# Patient Record
Sex: Female | Born: 1970 | Race: White | Hispanic: No | Marital: Single | Smoking: Never smoker
Health system: Southern US, Community
[De-identification: ages and names within clinical notes are randomized; demographics above are authoritative.]

## PROBLEM LIST (undated history)

## (undated) DIAGNOSIS — G709 Myoneural disorder, unspecified: Secondary | ICD-10-CM

## (undated) DIAGNOSIS — T8859XA Other complications of anesthesia, initial encounter: Secondary | ICD-10-CM

## (undated) DIAGNOSIS — R51 Headache: Secondary | ICD-10-CM

## (undated) DIAGNOSIS — E039 Hypothyroidism, unspecified: Secondary | ICD-10-CM

## (undated) DIAGNOSIS — R7303 Prediabetes: Secondary | ICD-10-CM

## (undated) DIAGNOSIS — Z9889 Other specified postprocedural states: Secondary | ICD-10-CM

## (undated) DIAGNOSIS — R112 Nausea with vomiting, unspecified: Secondary | ICD-10-CM

## (undated) DIAGNOSIS — K219 Gastro-esophageal reflux disease without esophagitis: Secondary | ICD-10-CM

## (undated) DIAGNOSIS — M5430 Sciatica, unspecified side: Secondary | ICD-10-CM

## (undated) DIAGNOSIS — I10 Essential (primary) hypertension: Secondary | ICD-10-CM

## (undated) DIAGNOSIS — F419 Anxiety disorder, unspecified: Secondary | ICD-10-CM

## (undated) DIAGNOSIS — D219 Benign neoplasm of connective and other soft tissue, unspecified: Secondary | ICD-10-CM

## (undated) HISTORY — PX: OTHER SURGICAL HISTORY: SHX169

## (undated) HISTORY — DX: Benign neoplasm of connective and other soft tissue, unspecified: D21.9

---

## 1998-04-17 ENCOUNTER — Emergency Department (HOSPITAL_COMMUNITY): Admission: EM | Admit: 1998-04-17 | Discharge: 1998-04-17 | Payer: Self-pay | Admitting: Emergency Medicine

## 1998-06-21 ENCOUNTER — Other Ambulatory Visit: Admission: RE | Admit: 1998-06-21 | Discharge: 1998-06-21 | Payer: Self-pay | Admitting: Obstetrics and Gynecology

## 1999-08-24 ENCOUNTER — Emergency Department (HOSPITAL_COMMUNITY): Admission: EM | Admit: 1999-08-24 | Discharge: 1999-08-24 | Payer: Self-pay | Admitting: Emergency Medicine

## 2000-10-27 ENCOUNTER — Emergency Department (HOSPITAL_COMMUNITY): Admission: EM | Admit: 2000-10-27 | Discharge: 2000-10-27 | Payer: Self-pay | Admitting: Emergency Medicine

## 2000-10-28 ENCOUNTER — Encounter: Payer: Self-pay | Admitting: Emergency Medicine

## 2001-12-09 ENCOUNTER — Emergency Department (HOSPITAL_COMMUNITY): Admission: EM | Admit: 2001-12-09 | Discharge: 2001-12-09 | Payer: Self-pay | Admitting: Emergency Medicine

## 2002-01-27 ENCOUNTER — Emergency Department (HOSPITAL_COMMUNITY): Admission: EM | Admit: 2002-01-27 | Discharge: 2002-01-27 | Payer: Self-pay | Admitting: Emergency Medicine

## 2002-01-27 ENCOUNTER — Encounter: Payer: Self-pay | Admitting: Emergency Medicine

## 2002-01-28 ENCOUNTER — Encounter: Payer: Self-pay | Admitting: Emergency Medicine

## 2002-12-23 ENCOUNTER — Emergency Department (HOSPITAL_COMMUNITY): Admission: EM | Admit: 2002-12-23 | Discharge: 2002-12-23 | Payer: Self-pay | Admitting: Emergency Medicine

## 2002-12-23 ENCOUNTER — Encounter: Payer: Self-pay | Admitting: Emergency Medicine

## 2002-12-23 ENCOUNTER — Ambulatory Visit (HOSPITAL_COMMUNITY): Admission: RE | Admit: 2002-12-23 | Discharge: 2002-12-23 | Payer: Self-pay | Admitting: Emergency Medicine

## 2004-01-15 ENCOUNTER — Emergency Department (HOSPITAL_COMMUNITY): Admission: EM | Admit: 2004-01-15 | Discharge: 2004-01-15 | Payer: Self-pay | Admitting: Family Medicine

## 2004-03-10 ENCOUNTER — Emergency Department (HOSPITAL_COMMUNITY): Admission: EM | Admit: 2004-03-10 | Discharge: 2004-03-11 | Payer: Self-pay | Admitting: Emergency Medicine

## 2005-04-26 ENCOUNTER — Emergency Department (HOSPITAL_COMMUNITY): Admission: EM | Admit: 2005-04-26 | Discharge: 2005-04-26 | Payer: Self-pay | Admitting: Emergency Medicine

## 2008-01-08 ENCOUNTER — Emergency Department (HOSPITAL_COMMUNITY): Admission: EM | Admit: 2008-01-08 | Discharge: 2008-01-08 | Payer: Self-pay | Admitting: Emergency Medicine

## 2009-02-25 ENCOUNTER — Ambulatory Visit: Payer: Self-pay | Admitting: Diagnostic Radiology

## 2009-02-25 ENCOUNTER — Emergency Department (HOSPITAL_BASED_OUTPATIENT_CLINIC_OR_DEPARTMENT_OTHER): Admission: EM | Admit: 2009-02-25 | Discharge: 2009-02-25 | Payer: Self-pay | Admitting: Emergency Medicine

## 2010-07-06 ENCOUNTER — Emergency Department (HOSPITAL_COMMUNITY): Admission: EM | Admit: 2010-07-06 | Discharge: 2009-08-20 | Payer: Self-pay | Admitting: Internal Medicine

## 2011-01-24 ENCOUNTER — Other Ambulatory Visit: Payer: Self-pay | Admitting: Obstetrics and Gynecology

## 2011-01-24 DIAGNOSIS — R928 Other abnormal and inconclusive findings on diagnostic imaging of breast: Secondary | ICD-10-CM

## 2011-01-30 ENCOUNTER — Ambulatory Visit
Admission: RE | Admit: 2011-01-30 | Discharge: 2011-01-30 | Disposition: A | Discharge status: Home / Self Care | Payer: BC Managed Care – PPO | Source: Ambulatory Visit | Attending: Obstetrics and Gynecology | Admitting: Obstetrics and Gynecology

## 2011-01-30 DIAGNOSIS — R928 Other abnormal and inconclusive findings on diagnostic imaging of breast: Secondary | ICD-10-CM

## 2011-04-26 LAB — PREGNANCY, URINE: Preg Test, Ur: NEGATIVE

## 2011-05-21 ENCOUNTER — Other Ambulatory Visit (HOSPITAL_COMMUNITY): Payer: BC Managed Care – PPO

## 2011-05-28 ENCOUNTER — Encounter: Payer: Self-pay | Admitting: *Deleted

## 2011-05-28 ENCOUNTER — Other Ambulatory Visit: Payer: Self-pay

## 2011-05-28 DIAGNOSIS — Z79899 Other long term (current) drug therapy: Secondary | ICD-10-CM | POA: Insufficient documentation

## 2011-05-28 DIAGNOSIS — R079 Chest pain, unspecified: Secondary | ICD-10-CM | POA: Insufficient documentation

## 2011-05-28 DIAGNOSIS — I10 Essential (primary) hypertension: Secondary | ICD-10-CM | POA: Insufficient documentation

## 2011-05-28 DIAGNOSIS — F411 Generalized anxiety disorder: Secondary | ICD-10-CM | POA: Insufficient documentation

## 2011-05-28 NOTE — ED Notes (Signed)
States she is upset over a co pay to her surgeon, states she is stressed about money, states she does have a job

## 2011-05-29 ENCOUNTER — Emergency Department (HOSPITAL_COMMUNITY)
Admission: EM | Admit: 2011-05-29 | Discharge: 2011-05-29 | Disposition: A | Discharge status: Home / Self Care | Payer: BC Managed Care – PPO | Attending: Emergency Medicine | Admitting: Emergency Medicine

## 2011-05-29 DIAGNOSIS — F419 Anxiety disorder, unspecified: Secondary | ICD-10-CM

## 2011-05-29 HISTORY — DX: Essential (primary) hypertension: I10

## 2011-05-29 MED ORDER — GI COCKTAIL ~~LOC~~
30.0000 mL | Freq: Once | ORAL | Status: AC
  Administered 2011-05-29: 30 mL via ORAL
  Filled 2011-05-29: qty 30

## 2011-05-29 NOTE — ED Notes (Addendum)
Pt reports epigastric discomfort that began this a.m. - pt states she took Tagamet at home w/ some relief. Pt denies any other associating symptoms. Pt concerned about her blood pressure being high and is experiencing stress over finances related to a surgery the pt is to have. Pt resting comfortably on bed in no acute distress.

## 2011-05-29 NOTE — ED Notes (Signed)
D/c instructions reviewed w/ pt - pt denies any further questions or concerns at present.   

## 2011-05-29 NOTE — ED Provider Notes (Signed)
History     CSN: [id number] Arrival date & time: 05/29/2011 12:47 AM   First MD Initiated Contact with Patient 05/29/11 0050      Chief Complaint  Patient presents with  . Anxiety     Patient is a 40 y.o. female presenting with anxiety. The history is provided by the patient.  Anxiety   patient reports she is anxious about an upcoming surgery and about her ability to be a lipase of this.  She reports she is stressed about money and she doesn't have a job.  She reports constant burning and discomfort in her chest today that has been improved and relieved by her Tagamet at home.  She is also reports she is concerned about her blood pressure.  She is on hydrochlorothiazide at home as prescribed by her OB/GYN.  His other shortness of breath.  She's had no nausea vomiting diarrhea or abdominal pain.  She's had no fevers or chills.  Nothing worsens her pain.  Her chest discomfort is improved by Tagamet.  Nothing is relieved her anxiety, her symptoms are described as mild.  Her chest discomfort as a 1/10.  Her chest discomfort is described as a discomfort and burning.  Past Medical History  Diagnosis Date  . Hypertension     Past Surgical History  Procedure Date  . Cesarean section   . Spleenectomy     No family history on file.  History  Substance Use Topics  . Smoking status: Never Smoker   . Smokeless tobacco: Not on file  . Alcohol Use: No    OB History    Grav Para Term Preterm Abortions TAB SAB Ect Mult Living                  Review of Systems  All other systems reviewed and are negative.    Allergies  Review of patient's allergies indicates no known allergies.  Home Medications   Cimetidine, ibuprofen, HCTZ, loratadine  BP 158/103  Pulse 81  Temp(Src) 97.7 F (36.5 C) (Oral)  Resp 20  Ht 5\' 2"  (1.575 m)  Wt 185 lb (83.915 kg)  BMI 33.84 kg/m2  SpO2 100%  LMP 05/15/2011  Physical Exam  Nursing note and vitals reviewed. Constitutional: She is  oriented to person, place, and time. She appears well-developed and well-nourished. No distress.  HENT:  Head: Normocephalic and atraumatic.  Eyes: EOM are normal.  Neck: Normal range of motion.  Cardiovascular: Normal rate, regular rhythm and normal heart sounds.   Pulmonary/Chest: Effort normal and breath sounds normal.  Abdominal: Soft. She exhibits no distension. There is no tenderness.  Musculoskeletal: Normal range of motion.  Neurological: She is alert and oriented to person, place, and time.  Skin: Skin is warm and dry.  Psychiatric: Judgment normal.       Anxious    ED Course  Procedures (including critical care time)  Labs Reviewed - No data to display No results found.  Date: 05/29/2011  Rate: 74  Rhythm: normal sinus rhythm  QRS Axis: normal  Intervals: normal  ST/T Wave abnormalities: normal  Conduction Disutrbances:none  Narrative Interpretation:   Old EKG Reviewed: No significant changes noted     1. Chest pain   2. Anxiety       MDM  Atypical chest pain with normal EKG.  Patient is very anxious.  She is to followup with her primary care physician.  Doubt ACS.  Doubt PE.  Doubt dissection.  Hypertension noted.  She is  on hydrochlorothiazide.  She's been encouraged to follow up with her primary care physician who is her OB/GYN at this time for recheck of her blood pressure and continued monitoring.        Lyanne Co, MD 05/29/11 [phone]

## 2011-06-25 ENCOUNTER — Inpatient Hospital Stay (HOSPITAL_COMMUNITY): Admission: RE | Admit: 2011-06-25 | Payer: BC Managed Care – PPO | Source: Ambulatory Visit

## 2011-06-28 ENCOUNTER — Encounter (HOSPITAL_COMMUNITY): Admission: RE | Payer: Self-pay | Source: Ambulatory Visit

## 2011-06-28 ENCOUNTER — Ambulatory Visit (HOSPITAL_COMMUNITY)
Admission: RE | Admit: 2011-06-28 | Payer: BC Managed Care – PPO | Source: Ambulatory Visit | Admitting: Obstetrics and Gynecology

## 2011-06-28 SURGERY — LIGATION, FALLOPIAN TUBE, LAPAROSCOPIC
Anesthesia: Choice

## 2011-12-14 ENCOUNTER — Encounter (HOSPITAL_COMMUNITY): Payer: Self-pay | Admitting: Pharmacist

## 2011-12-21 ENCOUNTER — Encounter (HOSPITAL_COMMUNITY)
Admission: RE | Admit: 2011-12-21 | Discharge: 2011-12-21 | Disposition: A | Discharge status: Home / Self Care | Payer: BC Managed Care – PPO | Source: Ambulatory Visit | Attending: Obstetrics and Gynecology | Admitting: Obstetrics and Gynecology

## 2011-12-21 ENCOUNTER — Encounter (HOSPITAL_COMMUNITY): Payer: Self-pay

## 2011-12-21 HISTORY — DX: Headache: R51

## 2011-12-21 HISTORY — DX: Gastro-esophageal reflux disease without esophagitis: K21.9

## 2011-12-21 HISTORY — DX: Hypothyroidism, unspecified: E03.9

## 2011-12-21 HISTORY — DX: Anxiety disorder, unspecified: F41.9

## 2011-12-21 LAB — CBC
MCH: 26.3 pg (ref 26.0–34.0)
MCV: 83.2 fL (ref 78.0–100.0)
Platelets: 469 10*3/uL — ABNORMAL HIGH (ref 150–400)
RDW: 13.5 % (ref 11.5–15.5)

## 2011-12-21 LAB — BASIC METABOLIC PANEL
Calcium: 9 mg/dL (ref 8.4–10.5)
Creatinine, Ser: 0.7 mg/dL (ref 0.50–1.10)
GFR calc Af Amer: >90 mL/min (ref >90)

## 2011-12-21 NOTE — H&P (Signed)
Christine Rush, PENDERGRAPH NO.:  [id number]  MEDICAL RECORD NO.:  [id number]  LOCATION:  [facility]                           FACILITY:  [facility]  PHYSICIAN:  Juluis Mire, M.D.   DATE OF BIRTH:  [birth year]  DATE OF ADMISSION:  12/21/2011 DATE OF DISCHARGE:                             HISTORY & PHYSICAL   DATE OF SURGERY:  Dec 27, 2011 at [facility].  The patient is a 41 year old, gravida 1, para 1 female, presents for laparoscopic bilateral tubal ligation, hysteroscopy with resectoscope, and hydrothermal ablation.  In relation to the present admission, the patient has regular cycles. They are relatively heavy with associated dysmenorrhea.  She has known uterine fibroids, but we will watch conservatively.  Because of increasing menstrual flow and discomfort, we are going to proceed with the above-noted surgery.  Alternatives have been discussed including the use of the Mirena IUD. We also discussed radiological embolization of the fibroids.  She declines this and wishes to have more aggressive therapy as noted above. She does not wish to proceed with hysterectomy at this point in time.  In terms of allergies, she has no drug allergies listed.  PAST MEDICAL HISTORY:  Usual childhood diseases.  No significant sequelae.  She is being managed for hypertension on hydrochlorothiazide.  PAST SURGICAL HISTORY:  She has had splenectomy and one cesarean section.  SOCIAL HISTORY:  No tobacco or alcohol use.  FAMILY HISTORY:  There is a history of breast cancer as well as heart disease.  REVIEW OF SYSTEMS:  Noncontributory.  PHYSICAL EXAMINATION:  VITAL SIGNS:  The patient is afebrile with stable vital signs. HEENT:  The patient is normocephalic.  Pupils equal, round, and reactive to light and accommodation.  Extraocular movements are intact.  Sclerae and conjunctivae are clear.  Oropharynx clear. NECK:  Not examined. BREASTS:  Not examined. LUNGS:   Clear. CARDIOVASCULAR SYSTEM:  Regular rhythm and rate.  There are no murmurs or gallops. ABDOMEN:  Her abdominal exam is benign.  No mass, organomegaly, or tenderness.  Well-healed low-transverse incision. PELVIC:  Normal external genitalia.  Vaginal mucosa is clear.  Cervix unremarkable.  Uterus 12-14 weeks in size consistent with uterine fibroids.  Adnexa unremarkable.  EXTREMITIES:  Trace edema. NEUROLOGIC:  Grossly within normal limits.  IMPRESSION: 1. Menorrhagia with associated uterine fibroids. 2. Desires sterility. 3. Hypertension.  PLAN:  The patient to undergo laparoscopic bilateral tubal ligation. The risks of the surgery have been explained including the risk of infection, the risk of hemorrhage that could require transfusion with the risk of AIDS or hepatitis.  Excessive bleeding that could require exploratory surgery.  Risk of injury to adjacent organs such as bladder, bowel, or ureters that could require further exploratory surgery, risk of deep venous thrombosis and pulmonary embolus.  The potential irreversibility of sterilization explained.  A failure rate of 1 in 200 failures can be in the form of ectopic pregnancy requiring further surgical management.  Alternatives for birth control have been explained.  With the ablation, success rates of approximately 80% are quoted.  The risks include as noted above plus the risk of uterine perforation that can lead to injury to adjacent organs  requiring exploratory surgery.  Excessive bleeding could require hysterectomy. The patient expressed understanding of the indications, risks, and alternatives.     Juluis Mire, M.D.     JSM/MEDQ  D:  12/21/2011  T:  12/21/2011  Job:  [id number]

## 2011-12-21 NOTE — Patient Instructions (Addendum)
YOUR PROCEDURE IS SCHEDULED ON:12/27/11  ENTER THROUGH THE MAIN ENTRANCE OF [facility] AT:6am  USE DESK PHONE AND DIAL [id number] TO INFORM us OF YOUR ARRIVAL  CALL [phone] IF YOU HAVE ANY QUESTIONS OR PROBLEMS PRIOR TO YOUR ARRIVAL.  REMEMBER: DO NOT EAT OR DRINK AFTER MIDNIGHT :Wed   YOU MAY BRUSH YOUR TEETH THE MORNING OF SURGERY   TAKE THESE MEDICINES THE DAY OF SURGERY WITH SIP OF WATER:BP med and Thyroid med   DO NOT WEAR JEWELRY, EYE MAKEUP, LIPSTICK OR DARK FINGERNAIL POLISH DO NOT WEAR LOTIONS  DO NOT SHAVE FOR 48 HOURS PRIOR TO SURGERY  YOU WILL NOT BE ALLOWED TO DRIVE YOURSELF HOME.  NAME OF DRIVER:Amber- daughter

## 2011-12-21 NOTE — H&P (Signed)
  Patient name Christine Rush, Glanz DICTATION#  [id number] CSN# [id number]  [facility], MD 12/21/2011 9:03 AM

## 2011-12-26 MED ORDER — CEFAZOLIN SODIUM-DEXTROSE 2-3 GM-% IV SOLR
2.0000 g | INTRAVENOUS | Status: AC
  Administered 2011-12-27: 2 g via INTRAVENOUS
  Filled 2011-12-26: qty 50

## 2011-12-27 ENCOUNTER — Encounter (HOSPITAL_COMMUNITY): Payer: Self-pay | Admitting: *Deleted

## 2011-12-27 ENCOUNTER — Ambulatory Visit (HOSPITAL_COMMUNITY): Payer: BC Managed Care – PPO | Admitting: Anesthesiology

## 2011-12-27 ENCOUNTER — Ambulatory Visit (HOSPITAL_COMMUNITY)
Admission: RE | Admit: 2011-12-27 | Discharge: 2011-12-27 | Disposition: A | Discharge status: Home / Self Care | Payer: BC Managed Care – PPO | Source: Ambulatory Visit | Attending: Obstetrics and Gynecology | Admitting: Obstetrics and Gynecology

## 2011-12-27 ENCOUNTER — Encounter (HOSPITAL_COMMUNITY)
Admission: RE | Disposition: A | Discharge status: Home / Self Care | Payer: Self-pay | Source: Ambulatory Visit | Attending: Obstetrics and Gynecology

## 2011-12-27 ENCOUNTER — Encounter (HOSPITAL_COMMUNITY): Payer: Self-pay | Admitting: Anesthesiology

## 2011-12-27 DIAGNOSIS — Z01812 Encounter for preprocedural laboratory examination: Secondary | ICD-10-CM | POA: Insufficient documentation

## 2011-12-27 DIAGNOSIS — D259 Leiomyoma of uterus, unspecified: Secondary | ICD-10-CM | POA: Insufficient documentation

## 2011-12-27 DIAGNOSIS — D219 Benign neoplasm of connective and other soft tissue, unspecified: Secondary | ICD-10-CM

## 2011-12-27 DIAGNOSIS — Z01818 Encounter for other preprocedural examination: Secondary | ICD-10-CM | POA: Insufficient documentation

## 2011-12-27 DIAGNOSIS — Z302 Encounter for sterilization: Secondary | ICD-10-CM | POA: Insufficient documentation

## 2011-12-27 DIAGNOSIS — N920 Excessive and frequent menstruation with regular cycle: Secondary | ICD-10-CM | POA: Insufficient documentation

## 2011-12-27 DIAGNOSIS — N979 Female infertility, unspecified: Secondary | ICD-10-CM

## 2011-12-27 HISTORY — PX: LAPAROSCOPIC TUBAL LIGATION: SHX1937

## 2011-12-27 LAB — HCG, SERUM, QUALITATIVE: Preg, Serum: NEGATIVE

## 2011-12-27 SURGERY — LIGATION, FALLOPIAN TUBE, LAPAROSCOPIC
Anesthesia: General | Site: Vagina | Wound class: Clean Contaminated

## 2011-12-27 MED ORDER — GLYCINE 1.5 % IR SOLN
Status: DC | PRN
  Administered 2011-12-27: 1

## 2011-12-27 MED ORDER — MIDAZOLAM HCL 5 MG/5ML IJ SOLN
INTRAMUSCULAR | Status: DC | PRN
  Administered 2011-12-27: 2 mg via INTRAVENOUS

## 2011-12-27 MED ORDER — FENTANYL CITRATE 0.05 MG/ML IJ SOLN
INTRAMUSCULAR | Status: DC | PRN
  Administered 2011-12-27 (×2): 50 ug via INTRAVENOUS
  Administered 2011-12-27: 100 ug via INTRAVENOUS

## 2011-12-27 MED ORDER — PROPOFOL 10 MG/ML IV EMUL
INTRAVENOUS | Status: AC
  Filled 2011-12-27: qty 20

## 2011-12-27 MED ORDER — DEXAMETHASONE SODIUM PHOSPHATE 10 MG/ML IJ SOLN
INTRAMUSCULAR | Status: AC
  Filled 2011-12-27: qty 1

## 2011-12-27 MED ORDER — METOCLOPRAMIDE HCL 5 MG/ML IJ SOLN
10.0000 mg | Freq: Once | INTRAMUSCULAR | Status: AC
  Administered 2011-12-27: 10 mg via INTRAVENOUS

## 2011-12-27 MED ORDER — PANTOPRAZOLE SODIUM 40 MG PO TBEC
40.0000 mg | DELAYED_RELEASE_TABLET | Freq: Once | ORAL | Status: AC
  Administered 2011-12-27: 40 mg via ORAL

## 2011-12-27 MED ORDER — GLYCOPYRROLATE 0.2 MG/ML IJ SOLN
INTRAMUSCULAR | Status: DC | PRN
  Administered 2011-12-27: 0.2 mg via INTRAVENOUS

## 2011-12-27 MED ORDER — LIDOCAINE-EPINEPHRINE (PF) 1 %-1:200000 IJ SOLN
INTRAMUSCULAR | Status: DC | PRN
  Administered 2011-12-27: 20 mL

## 2011-12-27 MED ORDER — BUPIVACAINE HCL (PF) 0.25 % IJ SOLN
INTRAMUSCULAR | Status: DC | PRN
  Administered 2011-12-27: 7 mL

## 2011-12-27 MED ORDER — FENTANYL CITRATE 0.05 MG/ML IJ SOLN: 25.0000 ug | INTRAMUSCULAR | Status: DC | PRN

## 2011-12-27 MED ORDER — ONDANSETRON HCL 4 MG/2ML IJ SOLN
INTRAMUSCULAR | Status: AC
  Filled 2011-12-27: qty 2

## 2011-12-27 MED ORDER — EPHEDRINE 5 MG/ML INJ
INTRAVENOUS | Status: AC
  Filled 2011-12-27: qty 10

## 2011-12-27 MED ORDER — LIDOCAINE-EPINEPHRINE (PF) 1 %-1:200000 IJ SOLN
INTRAMUSCULAR | Status: AC
  Filled 2011-12-27: qty 10

## 2011-12-27 MED ORDER — ONDANSETRON HCL 4 MG/2ML IJ SOLN
INTRAMUSCULAR | Status: DC | PRN
  Administered 2011-12-27: 4 mg via INTRAVENOUS

## 2011-12-27 MED ORDER — DEXAMETHASONE SODIUM PHOSPHATE 4 MG/ML IJ SOLN
INTRAMUSCULAR | Status: DC | PRN
  Administered 2011-12-27: 4 mg via INTRAVENOUS

## 2011-12-27 MED ORDER — PROPOFOL 10 MG/ML IV EMUL
INTRAVENOUS | Status: DC | PRN
  Administered 2011-12-27: 200 mg via INTRAVENOUS

## 2011-12-27 MED ORDER — GLYCOPYRROLATE 0.2 MG/ML IJ SOLN
INTRAMUSCULAR | Status: AC
  Filled 2011-12-27: qty 1

## 2011-12-27 MED ORDER — KETOROLAC TROMETHAMINE 30 MG/ML IJ SOLN
INTRAMUSCULAR | Status: DC | PRN
  Administered 2011-12-27: 30 mg via INTRAVENOUS

## 2011-12-27 MED ORDER — KETOROLAC TROMETHAMINE 30 MG/ML IJ SOLN: 15.0000 mg | Freq: Once | INTRAMUSCULAR | Status: DC | PRN

## 2011-12-27 MED ORDER — ROCURONIUM BROMIDE 50 MG/5ML IV SOLN
INTRAVENOUS | Status: AC
  Filled 2011-12-27: qty 1

## 2011-12-27 MED ORDER — MIDAZOLAM HCL 2 MG/2ML IJ SOLN
INTRAMUSCULAR | Status: AC
  Filled 2011-12-27: qty 2

## 2011-12-27 MED ORDER — LACTATED RINGERS IV SOLN
INTRAVENOUS | Status: DC | PRN
  Administered 2011-12-27 (×2): via INTRAVENOUS

## 2011-12-27 MED ORDER — SUCCINYLCHOLINE CHLORIDE 20 MG/ML IJ SOLN
INTRAMUSCULAR | Status: DC | PRN
  Administered 2011-12-27: 120 mg via INTRAVENOUS

## 2011-12-27 MED ORDER — BUPIVACAINE HCL (PF) 0.25 % IJ SOLN
INTRAMUSCULAR | Status: AC
  Filled 2011-12-27: qty 30

## 2011-12-27 MED ORDER — KETOROLAC TROMETHAMINE 30 MG/ML IJ SOLN
INTRAMUSCULAR | Status: AC
  Filled 2011-12-27: qty 1

## 2011-12-27 MED ORDER — PANTOPRAZOLE SODIUM 40 MG PO TBEC
DELAYED_RELEASE_TABLET | ORAL | Status: AC
  Filled 2011-12-27: qty 1

## 2011-12-27 MED ORDER — EPHEDRINE SULFATE 50 MG/ML IJ SOLN
INTRAMUSCULAR | Status: DC | PRN
  Administered 2011-12-27: 5 mg via INTRAVENOUS

## 2011-12-27 MED ORDER — CEFAZOLIN SODIUM 1-5 GM-% IV SOLN
INTRAVENOUS | Status: AC
  Filled 2011-12-27: qty 50

## 2011-12-27 MED ORDER — LIDOCAINE HCL (CARDIAC) 20 MG/ML IV SOLN
INTRAVENOUS | Status: AC
  Filled 2011-12-27: qty 5

## 2011-12-27 MED ORDER — LACTATED RINGERS IV SOLN
INTRAVENOUS | Status: DC
  Administered 2011-12-27: 06:00:00 via INTRAVENOUS

## 2011-12-27 MED ORDER — LIDOCAINE HCL (CARDIAC) 20 MG/ML IV SOLN
INTRAVENOUS | Status: DC | PRN
  Administered 2011-12-27: 60 mg via INTRAVENOUS

## 2011-12-27 MED ORDER — FENTANYL CITRATE 0.05 MG/ML IJ SOLN
INTRAMUSCULAR | Status: AC
  Filled 2011-12-27: qty 5

## 2011-12-27 MED ORDER — METOCLOPRAMIDE HCL 5 MG/ML IJ SOLN
INTRAMUSCULAR | Status: AC
  Administered 2011-12-27: 10 mg via INTRAVENOUS
  Filled 2011-12-27: qty 2

## 2011-12-27 SURGICAL SUPPLY — 19 items
CANISTER SUCTION 2500CC (MISCELLANEOUS) ×3 IMPLANT
CATH ROBINSON RED A/P 16FR (CATHETERS) ×3 IMPLANT
CATH THERMACHOICE III (CATHETERS) ×1 IMPLANT
CLOTH BEACON ORANGE TIMEOUT ST (SAFETY) ×3 IMPLANT
CONTAINER PREFILL 10% NBF 60ML (FORM) ×5 IMPLANT
DRSG COVADERM PLUS 2X2 (GAUZE/BANDAGES/DRESSINGS) ×1 IMPLANT
ELECT REM PT RETURN 9FT ADLT (ELECTROSURGICAL) ×3
ELECTRODE REM PT RTRN 9FT ADLT (ELECTROSURGICAL) ×2 IMPLANT
GLOVE BIO SURGEON STRL SZ7 (GLOVE) ×7 IMPLANT
NDL INSUFFLATION 14GA 120MM (NEEDLE) IMPLANT
NDL SPNL 20GX3.5 QUINCKE YW (NEEDLE) ×2 IMPLANT
NEEDLE INSUFFLATION 14GA 120MM (NEEDLE) IMPLANT
NEEDLE SPNL 20GX3.5 QUINCKE YW (NEEDLE) ×3 IMPLANT
PACK LAPAROSCOPY BASIN (CUSTOM PROCEDURE TRAY) ×3 IMPLANT
SET GENESYS HTA PROCERVA (MISCELLANEOUS) ×1 IMPLANT
SUT VIC AB 3-0 FS2 27 (SUTURE) ×3 IMPLANT
SUT VICRYL 0 UR6 27IN ABS (SUTURE) ×1 IMPLANT
TROCAR BALLN 12MMX100 BLUNT (TROCAR) ×1 IMPLANT
WARMER LAPAROSCOPE (MISCELLANEOUS) ×3 IMPLANT

## 2011-12-27 NOTE — Anesthesia Preprocedure Evaluation (Addendum)
Anesthesia Evaluation  Patient identified by MRN, date of birth, ID band Patient awake    Reviewed: Allergy & Precautions, H&P , NPO status , Patient's Chart, lab work & pertinent test results, reviewed documented beta blocker date and time   History of Anesthesia Complications Negative for: history of anesthetic complications  Airway Mallampati: I TM Distance: >3 FB Neck ROM: full    Dental  (+) Teeth Intact   Pulmonary neg pulmonary ROS,  breath sounds clear to auscultation  Pulmonary exam normal       Cardiovascular hypertension, Rhythm:regular Rate:Normal     Neuro/Psych  Headaches (migraines - infrequent, last was yesterday), PSYCHIATRIC DISORDERS (anxiety) negative psych ROS   GI/Hepatic Neg liver ROS, GERD-  ,  Endo/Other  Hypothyroidism obese  Renal/GU negative Renal ROS  negative genitourinary   Musculoskeletal   Abdominal   Peds  Hematology negative hematology ROS (+) 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   Dental Advisory Given  Plan Discussed with: CRNA and Surgeon  Anesthesia Plan Comments:         Anesthesia Quick Evaluation

## 2011-12-27 NOTE — Op Note (Signed)
Christine Rush, Christine Rush               ACCOUNT NO.:  [id number]  MEDICAL RECORD NO.:  [id number]  LOCATION:  [facility]                          FACILITY:  [facility]  PHYSICIAN:  Juluis Mire, M.D.   DATE OF BIRTH:  September 14, 1970  DATE OF PROCEDURE:  12/27/2011 DATE OF DISCHARGE:                              OPERATIVE REPORT   PREOPERATIVE DIAGNOSIS:  Menorrhagia secondary to uterine fibroids.  POSTOPERATIVE DIAGNOSIS:  Menorrhagia secondary to uterine fibroids.  OPERATIVE PROCEDURE: 1. Hysteroscopy with endometrial curettings. 2. Attempt at hydrothermal ablation, attempt at Thermachoice ablation,     attempt at rollerball ablation, all unsuccessful. 3. Laparoscopic bilateral tubal fulguration.  SURGEON:  Juluis Mire, MD.  ANESTHESIA:  General endotracheal.  ESTIMATED BLOOD LOSS:  Minimal.  PACKS AND DRAINS:  None.  INTRAOPERATIVE BLOOD PLACED:  None.  COMPLICATIONS:  None.  INDICATION:  Dictated in history and physical.  PROCEDURE IN DETAIL:  The patient was taken to the OR and placed in supine position.  After a satisfactory level of general endotracheal anesthesia was obtained, the patient was placed in dorsal lithotomy position using the Allen stirrups.  The abdomen, perineum, and vagina were prepped out with Betadine.  A speculum was placed in the vaginal vault.  The uterus on exam felt to be approximately 16 weeks.  The end of the cervix was grasped with a single-tooth tenaculum.  Uterus sounded to approximately 12 cm.  We dilated the cervix to a 23 Pratt dilator. Endometrial curettings were obtained.  The hydrothermal ablation scope was inserted.  We began distending the uterus.  She had a very large endometrial cavity.  There was no submucosal fibroids or polyps.  It kept indicating fluid loss.  We tried adjusting by clamping the cervix in two places, readjusting the scope, we failed multiple times. Decision was now turned to the Thermachoice ablation.  That unit  was brought in place.  We primed the unit.  Inserted the device, began inflating the balloon.  Despite instilling approximately 40 mL, we could never obtain enough of a pressure to use the unit.  Therefore, the balloon was deflated and removed.  We next got the resectoscope.  It was hooked to the glycine.  We inserted that.  She had a very large intrauterine cavity.  It had the rollerball on it.  We began using the rollerball in the endometrial lining.  However, we had just excessive fluid loss.  I could not see any signs of perforation.  I think what was happened is that her uterus just distended very quickly indicating progressive loss.  I did not proceed with further attempts at ablation. We only got about a quarter of the endometrial ablation using the rollerball.  At this point in time, we discontinued that.  Again, there was no signs of perforation.  There was no active bleeding.  A Hulka tenaculum was put in place.  Single-tooth tenaculum removed.  The patient's bladder was emptied by catheterization.  Subumbilical incision was made with a knife.  The incision was extended through the subcutaneous tissue.  Fascia was identified and entered sharply, incision fashioned laterally.  Peritoneum was entered with blunt finger pressure.  The open laparoscopic  trocar was put in place.  Abdomen inflated with carbon dioxide.  Laparoscope was introduced.  Uterus was massively enlarged, probably 18 weeks in size.  There was no signs of any free fluid in the abdomen or pelvis that would indicate a perforation.  We could identify both tubes and ovaries, which appeared to be normal using the bipolar.  A 2-cm segment of each tube was coagulated until resistance read 0.  Then we re-coagulated the same segment completely desiccating the tube.  At the end of the procedure, both tubes were adequately coagulated.  Ovaries again were unremarkable. There was no signs of perforation or active bleeding.   The laparoscope was removed.  Abdomen was deflated with carbon dioxide.  Open laparoscopic trocars were removed.  Fascia closed with figure-of-eight of 0 Vicryl.  Skin was closed with interrupted subcuticulars of 4-0 Vicryl.  The Hulka tenaculum was then removed.  The patient taken out of the dorsal lithotomy position.  Once alert and extubated, transferred to the recovery room in good condition.  Sponge, instrument, and needle count was reported as correct by circulating nurse x2.     Juluis Mire, M.D.     JSM/MEDQ  D:  12/27/2011  T:  12/27/2011  Job:  [id number]

## 2011-12-27 NOTE — Anesthesia Procedure Notes (Signed)
Procedure Name: Intubation Date/Time: 12/27/2011 7:35 AM Performed by: Kendal Hymen Pre-anesthesia Checklist: Emergency Drugs available, Timeout performed, Suction available, Patient being monitored and Patient identified Patient Re-evaluated:Patient Re-evaluated prior to inductionOxygen Delivery Method: Circle system utilized Preoxygenation: Pre-oxygenation with 100% oxygen Intubation Type: IV induction Ventilation: Mask ventilation without difficulty Laryngoscope Size: Miller and 2 Grade View: Grade II Tube type: Oral Tube size: 7.0 mm Number of attempts: 1 Airway Equipment and Method: Stylet Placement Confirmation: ETT inserted through vocal cords under direct vision,  breath sounds checked- equal and bilateral and positive ETCO2 Secured at: 21 cm Tube secured with: Tape Dental Injury: Teeth and Oropharynx as per pre-operative assessment

## 2011-12-27 NOTE — H&P (Signed)
  History and physical exam unchanged 

## 2011-12-27 NOTE — Brief Op Note (Signed)
12/27/2011  9:05 AM  PATIENT:  Osvaldo Human  41 y.o. female  PRE-OPERATIVE DIAGNOSIS:  fibroids, menorrhagia,desires sterility  POST-OPERATIVE DIAGNOSIS:  fibroids, menorrhagia,desires sterility  PROCEDURE:  Procedure(s) (LRB): LAPAROSCOPIC TUBAL LIGATION (Bilateral) DILATATION & CURETTAGE/HYSTEROSCOPY WITH RESECTOCOPE (N/A)  SURGEON:  Surgeon(s) and Role:    * Juluis Mire, MD - Primary  PHYSICIAN ASSISTANT:   ASSISTANTS: none   ANESTHESIA:   local, general and paracervical block  EBL:  Total I/O In: 1000 [I.V.:1000] Out: 100 [Urine:100]  BLOOD ADMINISTERED:none  DRAINS: none   LOCAL MEDICATIONS USED:  MARCAINE    and OTHER xylocaine with epinephrine  SPECIMEN:  Source of Specimen:  endometrial currettings  DISPOSITION OF SPECIMEN:  PATHOLOGY  COUNTS:  YES  TOURNIQUET:  * No tourniquets in log *  DICTATION: .Other Dictation: Dictation Number [id number]  PLAN OF CARE: Discharge to home after PACU  PATIENT DISPOSITION:  PACU - hemodynamically stable.   Delay start of Pharmacological VTE agent (>24hrs) due to surgical blood loss or risk of bleeding: no

## 2011-12-27 NOTE — Op Note (Signed)
Patient Christine Rush, Christine Rush DICTATION#  [id number] CSN# [id number]  Juluis Mire, MD 12/27/2011 9:07 AM

## 2011-12-27 NOTE — Anesthesia Postprocedure Evaluation (Signed)
Anesthesia Post Note  Patient: Christine Rush  Procedure(s) Performed: Procedure(s) (LRB): LAPAROSCOPIC TUBAL LIGATION (Bilateral) DILATATION & CURETTAGE/HYSTEROSCOPY WITH RESECTOCOPE (N/A)  Anesthesia type: General  Patient location: PACU  Post pain: Pain level controlled  Post assessment: Post-op Vital signs reviewed  Last Vitals:  Filed Vitals:   12/27/11 1000  BP:   Pulse: 79  Temp:   Resp:     Post vital signs: Reviewed  Level of consciousness: sedated  Complications: No apparent anesthesia complicationsfj

## 2011-12-27 NOTE — Transfer of Care (Signed)
Immediate Anesthesia Transfer of Care Note  Patient: Christine Rush  Procedure(s) Performed: Procedure(s) (LRB): LAPAROSCOPIC TUBAL LIGATION (Bilateral) DILATATION & CURETTAGE/HYSTEROSCOPY WITH RESECTOCOPE (N/A)  Patient Location: PACU  Anesthesia Type: General  Level of Consciousness: sedated and patient cooperative  Airway & Oxygen Therapy: Patient Spontanous Breathing and Patient connected to nasal cannula oxygen  Post-op Assessment: Report given to PACU RN and Post -op Vital signs reviewed and stable  Post vital signs: Reviewed and stable  Complications: No apparent anesthesia complications

## 2011-12-27 NOTE — Discharge Instructions (Signed)
Laparoscopic Tubal Ligation Care After Laparoscopic tubal ligation is an operation done with a long, lighted tube inserted through a small cut (incision) in the abdomen. The fallopian tubes are blocked by tying, clamping with a plastic clamp, or burning them closed with an electrocautery. Read the instructions outlined below and refer to this sheet in the next few weeks. These instructions provide you with general information on caring for yourself after you leave the hospital. Your caregiver may also give you specific instructions. While your treatment has been planned according to the most current medical practices available, unavoidable complications may occur. If you have any problems or questions after discharge, please call your caregiver. HOME CARE INSTRUCTIONS   It will be normal to be sore for a couple days following surgery.   Take your medicine and follow the instructions from your caregiver.   You may resume usual diet, exercise, driving and activities as allowed by your caregiver.   Do not have sexual intercourse until your caregiver gives you permission.   Do not drive while taking pain medicine.   Avoid lifting until you are instructed otherwise.   Use showers for bathing, until you are seen by your caregiver.   Change dressings if needed, and as directed.   Only take over-the-counter or prescription medicines for pain, discomfort or fever as directed by your caregiver.   Do not take aspirin because it can cause bleeding.   Take your temperature twice a day and record it.   Have someone stay with you the day you have the operation, and for a couple days afterward.   Make an appointment to see your caregiver for stitches (sutures) or staple removal and postoperative exams, as instructed.  SEEK MEDICAL CARE IF:   There is redness, swelling, or increasing pain in a wound.   There is drainage from a wound lasting longer than one day.   Your pain is getting worse.    You develop a rash.   You are having a reaction to your medicine.   You become dizzy or lightheaded.   You need stronger medicine or a change in your pain medicine.   You notice a foul smell coming from a wound or dressing.   There is a breaking open of a wound after the stitches, staples, or skin adhesive strips have been removed.   You develop constipation.  SEEK IMMEDIATE MEDICAL CARE IF:   You have an oral temperature above 102 F (38.9 C), not controlled by medicine.   There is increasing abdominal pain.   You develop pain in your shoulders (shoulder strap areas) which becomes more severe. Some pain is common, because of the gas inserted into your abdomen during the procedure.   You develop bleeding or drainage from the suture sites or vagina (birth canal) following surgery.   You pass out.   You develop shortness of breath or difficulty breathing.   You develop chest or leg pain.   You develop persistent nausea, vomiting or diarrhea.  MAKE SURE YOU:   Understand these instructions.   Watch your condition.   Get help right away if you are not doing well or get worse.  Document Released: 02/02/2005 Document Revised: 07/05/2011 Document Reviewed: 08/01/2009 ExitCare Patient Information 2012 ExitCare, LLCDISCHARGE INSTRUCTIONS: Laparoscopy  The following instructions have been prepared to help you care for yourself upon your return home today.  Wound care: Marland Kitchen Do not get the incision wet for the first 24 hours. The incision should be kept clean  and dry. . The Band-Aids or dressings may be removed the day after surgery. . Should the incision become sore, red, and swollen after the first week, check with your doctor.  Personal hygiene: . Shower the day after your procedure.  Activity and limitations: . Do NOT drive or operate any equipment today. . Do NOT lift anything more than 15 pounds for 2-3 weeks after surgery. . Do NOT rest in bed all day. . Walking  is encouraged. Walk each day, starting slowly with 5-minute walks 3 or 4 times a day. Slowly increase the length of your walks. . Walk up and down stairs slowly. . Do NOT do strenuous activities, such as golfing, playing tennis, bowling, running, biking, weight lifting, gardening, mowing, or vacuuming for 2-4 weeks. Ask your doctor when it is okay to start.  Diet: Eat a light meal as desired this evening. You may resume your usual diet tomorrow.  Return to work: This is dependent on the type of work you do. For the most part you can return to a desk job within a week of surgery. If you are more active at work, please discuss this with your doctor.  What to expect after your surgery: You may have a slight burning sensation when you urinate on the first day. You may have a very small amount of blood in the urine. Expect to have a small amount of vaginal discharge/light bleeding for 1-2 weeks. It is not unusual to have abdominal soreness and bruising for up to 2 weeks. You may be tired and need more rest for about 1 week. You may experience shoulder pain for 24-72 hours. Lying flat in bed may relieve it.  Call your doctor for any of the following: . Develop a fever of 100.4 or greater . Inability to urinate 6 hours after discharge from hospital . Severe pain not relieved by pain medications . Persistent of heavy bleeding at incision site . Redness or swelling around incision site after a week . Increasing nausea or vomiting  Patient Signature________________________________________ Nurse Signature_________________________________________ .

## 2011-12-28 ENCOUNTER — Encounter (HOSPITAL_COMMUNITY): Payer: Self-pay | Admitting: Obstetrics and Gynecology

## 2012-04-12 ENCOUNTER — Inpatient Hospital Stay (HOSPITAL_COMMUNITY): Payer: BC Managed Care – PPO

## 2012-04-12 ENCOUNTER — Inpatient Hospital Stay (HOSPITAL_COMMUNITY)
Admission: AD | Admit: 2012-04-12 | Discharge: 2012-04-12 | Disposition: A | Discharge status: Home / Self Care | Payer: BC Managed Care – PPO | Source: Ambulatory Visit | Attending: Obstetrics and Gynecology | Admitting: Obstetrics and Gynecology

## 2012-04-12 ENCOUNTER — Encounter (HOSPITAL_COMMUNITY): Payer: Self-pay | Admitting: *Deleted

## 2012-04-12 DIAGNOSIS — N939 Abnormal uterine and vaginal bleeding, unspecified: Secondary | ICD-10-CM

## 2012-04-12 DIAGNOSIS — D259 Leiomyoma of uterus, unspecified: Secondary | ICD-10-CM | POA: Insufficient documentation

## 2012-04-12 DIAGNOSIS — Z30432 Encounter for removal of intrauterine contraceptive device: Secondary | ICD-10-CM | POA: Insufficient documentation

## 2012-04-12 DIAGNOSIS — N898 Other specified noninflammatory disorders of vagina: Secondary | ICD-10-CM

## 2012-04-12 DIAGNOSIS — N949 Unspecified condition associated with female genital organs and menstrual cycle: Secondary | ICD-10-CM | POA: Insufficient documentation

## 2012-04-12 MED ORDER — PROGESTERONE MICRONIZED 200 MG PO CAPS: ORAL_CAPSULE | ORAL | Status: DC

## 2012-04-12 MED ORDER — KETOROLAC TROMETHAMINE 60 MG/2ML IM SOLN: 60.0000 mg | Freq: Once | INTRAMUSCULAR | Status: DC

## 2012-04-12 NOTE — MAU Note (Signed)
Pt presents for bleeding.  She had a Mirena IUD placed a month ago and started her period on Tuesday.  She was at work this morning and started bleeding heavily and passing clots.  She also states being able to feel her IUD strings more than previously.

## 2012-04-12 NOTE — MAU Provider Note (Signed)
History     CSN: [id number]  Arrival date and time: 04/12/12 1513   First Provider Initiated Contact with Patient 04/12/12 1642      Chief Complaint  Patient presents with  . Pelvic Pain   HPI Pt is not pregnant with history fibroids with unsuccessful attempted endometrial ablation June 2013. pt  had Mirena IUD placement one month ago to help shrink her fibroids.  Pt started having heavy bleeding 2 days ago.    Past Medical History  Diagnosis Date  . Hypertension   . Hypothyroidism   . GERD (gastroesophageal reflux disease)   . Headache   . Anxiety     Past Surgical History  Procedure Date  . Cesarean section   . Spleenectomy   . Laparoscopic tubal ligation 12/27/2011    Procedure: LAPAROSCOPIC TUBAL LIGATION;  Surgeon: Juluis Mire, MD;  Location: [facility];  Service: Gynecology;  Laterality: Bilateral;    Family History  Problem Relation Age of Onset  . Heart disease Mother   . Cancer Maternal Aunt     History  Substance Use Topics  . Smoking status: Never Smoker   . Smokeless tobacco: Not on file  . Alcohol Use: No    Allergies: No Known Allergies  Prescriptions prior to admission  Medication Sig Dispense Refill  . aspirin-acetaminophen-caffeine (EXCEDRIN MIGRAINE) 250-250-65 MG per tablet Take 1 tablet by mouth every 6 (six) hours as needed. For migraine.      Marland Kitchen BILBERRY, VACCINIUM MYRTILLUS, PO Take by mouth. Pt thinks she takes 1000mg .      . calcium carbonate (OS-CAL) 600 MG TABS Take 600 mg by mouth daily.      . cholecalciferol (VITAMIN D) 1000 UNITS tablet Take 2,000 Units by mouth daily.      . cimetidine (TAGAMET) 200 MG tablet Take 200 mg by mouth daily as needed. For acid reflux      . Cyanocobalamin (VITAMIN B-12 PO) Take by mouth every morning.      . hydrochlorothiazide (MICROZIDE) 12.5 MG capsule Take 12.5 mg by mouth daily.        Marland Kitchen levothyroxine (SYNTHROID, LEVOTHROID) 100 MCG tablet Take 100 mcg by mouth daily.      . naproxen sodium  (ANAPROX) 220 MG tablet Take 220 mg by mouth 2 (two) times daily as needed. For migraines.      . vitamin A 8000 UNIT capsule Take 8,000 Units by mouth daily.        . cetirizine (ZYRTEC) 10 MG tablet Take 10 mg by mouth daily.      Marland Kitchen ibuprofen (ADVIL,MOTRIN) 200 MG tablet Take 400 mg by mouth every 6 (six) hours as needed. For pain        Review of Systems  Constitutional: Negative for fever and chills.  Gastrointestinal: Positive for abdominal pain. Negative for nausea, diarrhea and constipation.  Genitourinary: Negative for dysuria, urgency and frequency.   Physical Exam   Blood pressure 139/99, pulse 81, temperature 97.9 F (36.6 C), temperature source Oral, resp. rate 18, height 5\' 1"  (1.549 m), weight 84.46 kg (186 lb 3.2 oz), last menstrual period 04/08/2012.  Physical Exam  Nursing note and vitals reviewed. Constitutional: She is oriented to person, place, and time. She appears well-developed.  HENT:  Head: Normocephalic.  Eyes: Pupils are equal, round, and reactive to light.  Neck: Normal range of motion. Neck supple.  Cardiovascular: Normal rate.        Recheck BP 127/66  Respiratory: Effort normal.  GI:  Soft.  Genitourinary:       IUD partially expelled and removed with ring forceps intact without difficulty with minimal pain.  Mod amount amount blood in vault  Musculoskeletal: Normal range of motion.  Neurological: She is alert and oriented to person, place, and time.  Skin: Skin is warm and dry.  Psychiatric: She has a normal mood and affect.    MAU Course  Procedures Fibroid uterus IUD removed-partially expelled Discussed with Dr. Henderson Cloud   Assessment and Plan  IUD removal  Fibroid uterus Prometrium 100mg  one at bedtime- if increase in bleeding Prometrium 100mg  BID until appointment on Thursday  Sanel Stemmer 04/12/2012, 4:45 PM

## 2012-04-12 NOTE — MAU Note (Signed)
IUD placed one month ago. Pt reports this is her first period since placement. Stated she started having increased cramping and heavy vaginal bleeding. Reports Strings are hanging out of her vagina.

## 2012-04-18 ENCOUNTER — Other Ambulatory Visit: Payer: Self-pay | Admitting: Obstetrics and Gynecology

## 2012-04-18 DIAGNOSIS — Z1231 Encounter for screening mammogram for malignant neoplasm of breast: Secondary | ICD-10-CM

## 2012-04-22 ENCOUNTER — Ambulatory Visit: Payer: BC Managed Care – PPO

## 2012-05-05 ENCOUNTER — Other Ambulatory Visit: Payer: Self-pay | Admitting: Obstetrics and Gynecology

## 2012-05-05 DIAGNOSIS — D259 Leiomyoma of uterus, unspecified: Secondary | ICD-10-CM

## 2012-05-05 DIAGNOSIS — N920 Excessive and frequent menstruation with regular cycle: Secondary | ICD-10-CM

## 2012-05-13 ENCOUNTER — Ambulatory Visit
Admission: RE | Admit: 2012-05-13 | Discharge: 2012-05-13 | Disposition: A | Discharge status: Home / Self Care | Payer: BC Managed Care – PPO | Source: Ambulatory Visit | Attending: Obstetrics and Gynecology | Admitting: Obstetrics and Gynecology

## 2012-05-13 DIAGNOSIS — Z1231 Encounter for screening mammogram for malignant neoplasm of breast: Secondary | ICD-10-CM

## 2012-05-15 ENCOUNTER — Ambulatory Visit
Admission: RE | Admit: 2012-05-15 | Discharge: 2012-05-15 | Disposition: A | Discharge status: Home / Self Care | Payer: BC Managed Care – PPO | Source: Ambulatory Visit | Attending: Obstetrics and Gynecology | Admitting: Obstetrics and Gynecology

## 2012-05-15 DIAGNOSIS — D259 Leiomyoma of uterus, unspecified: Secondary | ICD-10-CM

## 2012-05-15 DIAGNOSIS — N920 Excessive and frequent menstruation with regular cycle: Secondary | ICD-10-CM

## 2012-05-15 NOTE — Progress Notes (Signed)
Patient unable to remember exact date of start of last period; about 3 weeks ago.  jkl

## 2012-05-22 ENCOUNTER — Other Ambulatory Visit: Payer: BC Managed Care – PPO

## 2012-05-28 ENCOUNTER — Ambulatory Visit
Admission: RE | Admit: 2012-05-28 | Discharge: 2012-05-28 | Disposition: A | Discharge status: Home / Self Care | Payer: BC Managed Care – PPO | Source: Ambulatory Visit | Attending: Obstetrics and Gynecology | Admitting: Obstetrics and Gynecology

## 2012-05-28 DIAGNOSIS — N920 Excessive and frequent menstruation with regular cycle: Secondary | ICD-10-CM

## 2012-05-28 DIAGNOSIS — D259 Leiomyoma of uterus, unspecified: Secondary | ICD-10-CM

## 2012-05-28 MED ORDER — GADOBENATE DIMEGLUMINE 529 MG/ML IV SOLN
17.0000 mL | Freq: Once | INTRAVENOUS | Status: AC | PRN
  Administered 2012-05-28: 17 mL via INTRAVENOUS

## 2012-05-30 ENCOUNTER — Telehealth: Payer: Self-pay | Admitting: Emergency Medicine

## 2012-05-30 NOTE — Telephone Encounter (Signed)
ERROR

## 2012-06-04 ENCOUNTER — Telehealth: Payer: Self-pay | Admitting: Emergency Medicine

## 2012-06-04 NOTE — Telephone Encounter (Signed)
LM FOR PT TO CALL ME BACK ABOUT HER MRI RESULTS AND TO SEE IF SHE HAD AN EBX OR PAP RECENTLY.  PT CALLED BACK AND WAS MADE AWARE THAT SHE WILL NEED AN EBX AND THAT HER MRI LOOKS GOOD FOR OUR PROCEDURE.  TOLD PT. TO EXPECT A CALL FROM HER GYN OFFICE TO GET HER SCHEDULED FOR THAT PROCEDURE.

## 2012-06-16 ENCOUNTER — Other Ambulatory Visit: Payer: Self-pay | Admitting: Obstetrics and Gynecology

## 2012-06-19 ENCOUNTER — Telehealth: Payer: Self-pay | Admitting: Emergency Medicine

## 2012-06-19 NOTE — Telephone Encounter (Signed)
LM FOR PT TO MAKE HER AWARE THAT EVERYTHING LOOKS GOOD FOR UFE.  EBX AND MRI.   WILL SUBMIT EVERYTHING TO INS. AND HAVE TINA W/ [facility] CONTACT PT.   PT CALLED BACK AT 12:05PM- WILL SUBMIT EVERYTHING TO INS. AND HAVE TINA AT [facility] TO CONTACT PT TO SET UP PROCEDURE.

## 2012-06-23 ENCOUNTER — Other Ambulatory Visit: Payer: Self-pay | Admitting: Interventional Radiology

## 2012-06-23 DIAGNOSIS — D219 Benign neoplasm of connective and other soft tissue, unspecified: Secondary | ICD-10-CM

## 2012-08-08 ENCOUNTER — Telehealth: Payer: Self-pay | Admitting: Emergency Medicine

## 2012-08-08 NOTE — Telephone Encounter (Signed)
CALLED PT TO LET HER KNOW..WE FINALLY GOT HER APPROVAL FOR THE [place] PROCEDURE.  TOLD HER TINA WILL CALL HER TO SET UP PROCDURE!!! :)

## 2012-08-11 ENCOUNTER — Other Ambulatory Visit (HOSPITAL_COMMUNITY): Payer: Self-pay | Admitting: Interventional Radiology

## 2012-09-05 ENCOUNTER — Encounter (HOSPITAL_COMMUNITY): Payer: Self-pay | Admitting: Pharmacy Technician

## 2012-09-05 ENCOUNTER — Other Ambulatory Visit: Payer: Self-pay | Admitting: Radiology

## 2012-09-09 ENCOUNTER — Encounter (HOSPITAL_COMMUNITY): Payer: Self-pay

## 2012-09-09 ENCOUNTER — Ambulatory Visit (HOSPITAL_COMMUNITY)
Admission: RE | Admit: 2012-09-09 | Discharge: 2012-09-09 | Disposition: A | Discharge status: Home / Self Care | Payer: BC Managed Care – PPO | Source: Ambulatory Visit | Attending: Interventional Radiology | Admitting: Interventional Radiology

## 2012-09-09 ENCOUNTER — Observation Stay (HOSPITAL_COMMUNITY)
Admission: RE | Admit: 2012-09-09 | Discharge: 2012-09-10 | Disposition: A | Discharge status: Home / Self Care | Payer: BC Managed Care – PPO | Source: Ambulatory Visit | Attending: Interventional Radiology | Admitting: Interventional Radiology

## 2012-09-09 VITALS — BP 127/78 | HR 83 | Temp 98.6°F | Resp 16

## 2012-09-09 VITALS — BP 116/72 | HR 70 | Temp 98.0°F | Resp 21 | Ht 61.0 in | Wt 184.1 lb

## 2012-09-09 DIAGNOSIS — D219 Benign neoplasm of connective and other soft tissue, unspecified: Secondary | ICD-10-CM

## 2012-09-09 DIAGNOSIS — E039 Hypothyroidism, unspecified: Secondary | ICD-10-CM | POA: Insufficient documentation

## 2012-09-09 DIAGNOSIS — K219 Gastro-esophageal reflux disease without esophagitis: Secondary | ICD-10-CM | POA: Insufficient documentation

## 2012-09-09 DIAGNOSIS — I10 Essential (primary) hypertension: Secondary | ICD-10-CM | POA: Insufficient documentation

## 2012-09-09 DIAGNOSIS — Z79899 Other long term (current) drug therapy: Secondary | ICD-10-CM | POA: Insufficient documentation

## 2012-09-09 DIAGNOSIS — D259 Leiomyoma of uterus, unspecified: Principal | ICD-10-CM | POA: Insufficient documentation

## 2012-09-09 DIAGNOSIS — N852 Hypertrophy of uterus: Secondary | ICD-10-CM | POA: Insufficient documentation

## 2012-09-09 LAB — CBC WITH DIFFERENTIAL/PLATELET
Basophils Absolute: 0.1 10*3/uL (ref 0.0–0.1)
Basophils Relative: 1 % (ref 0–1)
Eosinophils Relative: 3 % (ref 0–5)
Hemoglobin: 13.6 g/dL (ref 12.0–15.0)
Lymphs Abs: 2.1 10*3/uL (ref 0.7–4.0)
MCH: 27.8 pg (ref 26.0–34.0)
MCV: 83.3 fL (ref 78.0–100.0)
Monocytes Relative: 8 % (ref 3–12)
RBC: 4.9 MIL/uL (ref 3.87–5.11)

## 2012-09-09 LAB — BASIC METABOLIC PANEL
CO2: 26 mEq/L (ref 19–32)
Calcium: 8.9 mg/dL (ref 8.4–10.5)
Glucose, Bld: 116 mg/dL — ABNORMAL HIGH (ref 70–99)
Potassium: 3.7 mEq/L (ref 3.5–5.1)
Sodium: 133 mEq/L — ABNORMAL LOW (ref 135–145)

## 2012-09-09 LAB — PROTIME-INR
INR: 0.89 (ref 0.00–1.49)
Prothrombin Time: 12 seconds (ref 11.6–15.2)

## 2012-09-09 LAB — HCG, SERUM, QUALITATIVE: Preg, Serum: NEGATIVE

## 2012-09-09 MED ORDER — ONDANSETRON HCL 4 MG/2ML IJ SOLN: 4.0000 mg | Freq: Four times a day (QID) | INTRAMUSCULAR | Status: DC | PRN

## 2012-09-09 MED ORDER — MIDAZOLAM HCL 2 MG/2ML IJ SOLN
INTRAMUSCULAR | Status: AC
  Filled 2012-09-09: qty 6

## 2012-09-09 MED ORDER — PROMETHAZINE HCL 25 MG/ML IJ SOLN
INTRAMUSCULAR | Status: AC
  Filled 2012-09-09: qty 1

## 2012-09-09 MED ORDER — HYDROMORPHONE 0.3 MG/ML IV SOLN
INTRAVENOUS | Status: DC
  Administered 2012-09-09: 12:00:00 via INTRAVENOUS

## 2012-09-09 MED ORDER — NALOXONE HCL 0.4 MG/ML IJ SOLN: 0.4000 mg | INTRAMUSCULAR | Status: DC | PRN

## 2012-09-09 MED ORDER — DIPHENHYDRAMINE HCL 50 MG/ML IJ SOLN: 12.5000 mg | Freq: Four times a day (QID) | INTRAMUSCULAR | Status: DC | PRN

## 2012-09-09 MED ORDER — LEVOTHYROXINE SODIUM 100 MCG PO TABS
100.0000 ug | ORAL_TABLET | Freq: Every day | ORAL | Status: DC
  Filled 2012-09-09: qty 1

## 2012-09-09 MED ORDER — LIDOCAINE HCL 1 % IJ SOLN
INTRAMUSCULAR | Status: AC
  Filled 2012-09-09: qty 20

## 2012-09-09 MED ORDER — ONDANSETRON HCL 4 MG/2ML IJ SOLN
INTRAMUSCULAR | Status: AC
  Filled 2012-09-09: qty 2

## 2012-09-09 MED ORDER — FAMOTIDINE 20 MG PO TABS: 20.0000 mg | ORAL_TABLET | Freq: Every day | ORAL | Status: DC

## 2012-09-09 MED ORDER — FENTANYL CITRATE 0.05 MG/ML IJ SOLN
INTRAMUSCULAR | Status: AC
  Filled 2012-09-09: qty 6

## 2012-09-09 MED ORDER — DIPHENHYDRAMINE HCL 12.5 MG/5ML PO ELIX
12.5000 mg | ORAL_SOLUTION | Freq: Four times a day (QID) | ORAL | Status: DC | PRN
  Filled 2012-09-09: qty 5

## 2012-09-09 MED ORDER — SODIUM CHLORIDE 0.9 % IV SOLN: INTRAVENOUS | Status: DC

## 2012-09-09 MED ORDER — HYDROMORPHONE HCL PF 2 MG/ML IJ SOLN
INTRAMUSCULAR | Status: AC
  Filled 2012-09-09: qty 2

## 2012-09-09 MED ORDER — HYDROMORPHONE 0.3 MG/ML IV SOLN
INTRAVENOUS | Status: DC
  Filled 2012-09-09: qty 25

## 2012-09-09 MED ORDER — PROMETHAZINE HCL 25 MG/ML IJ SOLN
12.5000 mg | Freq: Once | INTRAMUSCULAR | Status: AC
  Administered 2012-09-09: 12.5 mg via INTRAVENOUS

## 2012-09-09 MED ORDER — KETOROLAC TROMETHAMINE 30 MG/ML IJ SOLN
30.0000 mg | Freq: Once | INTRAMUSCULAR | Status: AC
  Administered 2012-09-09: 30 mg via INTRAVENOUS

## 2012-09-09 MED ORDER — SODIUM CHLORIDE 0.9 % IJ SOLN: 9.0000 mL | INTRAMUSCULAR | Status: DC | PRN

## 2012-09-09 MED ORDER — HYDROCHLOROTHIAZIDE 12.5 MG PO CAPS
12.5000 mg | ORAL_CAPSULE | Freq: Every morning | ORAL | Status: DC
  Filled 2012-09-09: qty 1

## 2012-09-09 MED ORDER — HYDROMORPHONE 0.3 MG/ML IV SOLN
INTRAVENOUS | Status: DC
  Administered 2012-09-09: 0.799 mg via INTRAVENOUS
  Administered 2012-09-09: 0.999 mg via INTRAVENOUS
  Administered 2012-09-10: 0.6 mg via INTRAVENOUS
  Administered 2012-09-10: 0.399 mg via INTRAVENOUS
  Administered 2012-09-10: 0.6 mg via INTRAVENOUS

## 2012-09-09 MED ORDER — IOHEXOL 300 MG/ML  SOLN
100.0000 mL | Freq: Once | INTRAMUSCULAR | Status: AC | PRN
  Administered 2012-09-09: 95 mL via INTRA_ARTERIAL

## 2012-09-09 MED ORDER — LEVOTHYROXINE SODIUM 100 MCG PO TABS
100.0000 ug | ORAL_TABLET | Freq: Every day | ORAL | Status: DC
  Administered 2012-09-10: 100 ug via ORAL
  Filled 2012-09-09 (×2): qty 1

## 2012-09-09 MED ORDER — DIPHENHYDRAMINE HCL 12.5 MG/5ML PO ELIX: 12.5000 mg | ORAL_SOLUTION | Freq: Four times a day (QID) | ORAL | Status: DC | PRN

## 2012-09-09 MED ORDER — FAMOTIDINE 20 MG PO TABS
20.0000 mg | ORAL_TABLET | Freq: Every day | ORAL | Status: DC
  Filled 2012-09-09: qty 1

## 2012-09-09 MED ORDER — KETOROLAC TROMETHAMINE 30 MG/ML IJ SOLN
INTRAMUSCULAR | Status: AC
  Filled 2012-09-09: qty 1

## 2012-09-09 MED ORDER — ONDANSETRON HCL 4 MG/2ML IJ SOLN
4.0000 mg | Freq: Once | INTRAMUSCULAR | Status: AC
  Administered 2012-09-09: 4 mg via INTRAVENOUS

## 2012-09-09 MED ORDER — CEFAZOLIN SODIUM-DEXTROSE 2-3 GM-% IV SOLR
2.0000 g | Freq: Once | INTRAVENOUS | Status: AC
  Administered 2012-09-09: 2 g via INTRAVENOUS
  Filled 2012-09-09 (×2): qty 50

## 2012-09-09 MED ORDER — MIDAZOLAM HCL 2 MG/2ML IJ SOLN
INTRAMUSCULAR | Status: AC | PRN
  Administered 2012-09-09: 0.5 mg via INTRAVENOUS
  Administered 2012-09-09: 1 mg via INTRAVENOUS
  Administered 2012-09-09: 2 mg via INTRAVENOUS
  Administered 2012-09-09: 0.5 mg via INTRAVENOUS
  Administered 2012-09-09 (×2): 1 mg via INTRAVENOUS

## 2012-09-09 MED ORDER — ONDANSETRON HCL 4 MG/2ML IJ SOLN
4.0000 mg | Freq: Four times a day (QID) | INTRAMUSCULAR | Status: DC | PRN
  Administered 2012-09-09: 4 mg via INTRAVENOUS
  Filled 2012-09-09: qty 2

## 2012-09-09 MED ORDER — FENTANYL CITRATE 0.05 MG/ML IJ SOLN
INTRAMUSCULAR | Status: AC | PRN
  Administered 2012-09-09 (×2): 25 ug via INTRAVENOUS
  Administered 2012-09-09: 50 ug via INTRAVENOUS

## 2012-09-09 MED ORDER — HYDROCHLOROTHIAZIDE 12.5 MG PO CAPS
12.5000 mg | ORAL_CAPSULE | Freq: Every morning | ORAL | Status: DC
  Administered 2012-09-10: 12.5 mg via ORAL
  Filled 2012-09-09: qty 1

## 2012-09-09 NOTE — Progress Notes (Signed)
R groin bandage C,D&I. R pedal pulse +3.

## 2012-09-09 NOTE — Progress Notes (Signed)
R groin bandage C,D&I. R pedal pulse +2.

## 2012-09-09 NOTE — Progress Notes (Signed)
R groin bandage clean, dry, intact, pedal pulse +3.

## 2012-09-09 NOTE — ED Notes (Signed)
5FR R Fem artery sheath removed by Dr. Grace Isaac. Manual pressure held X 15 mins. R groin level 0. 3+RDP.  Gauze tegaderm bandage applied.

## 2012-09-09 NOTE — Progress Notes (Signed)
R groin bandage CD&I, R pedal pulse +3. 

## 2012-09-09 NOTE — Progress Notes (Signed)
R groin bandage C,D, &I.  R pedal pulse +3.

## 2012-09-09 NOTE — Progress Notes (Signed)
R groin bandage C,D, &I.  R pedal pulse +3. 

## 2012-09-09 NOTE — Progress Notes (Signed)
R groin bandage clean, dry, and intact. Pedal pulse +3.

## 2012-09-09 NOTE — Progress Notes (Signed)
Post procedure check. Pt resting comfortably, denies much pain, using PCA No nausea, ready to eat.  BP 110/96  Pulse 62  Temp(Src) 98 F (36.7 C) (Oral)  Resp 16  Ht 5\' 1"  (1.549 m)  Wt 184 lb 1.4 oz (83.5 kg)  BMI 34.8 kg/m2  SpO2 100%  LMP 08/11/2012 Abdomen: soft, NT. Rt groin site clean, dry, soft, no hematoma Urine from Foley clear Ext: warm, 2+ pedal pulses  Plan for end of bedrest time in next 1 hr, DC foley thereafter. Increase diet. Transition to po pain meds in am prior to DC.

## 2012-09-09 NOTE — Progress Notes (Signed)
R groin bandage CD&I, R pedal pulse +3.

## 2012-09-09 NOTE — H&P (Signed)
Chief Complaint: "I'm here for my fibroid procedure"  HPI: Christine Rush is an 42 y.o. female with hx of symptomatic uterine fibroids. She was seen in consult by Dr. Grace Isaac for consideration of [place]. See PACS IR Rad Eval for full details. She has since been approved and had a Pelvic MRI. She is now scheduled for procedure. She denies any changes to PMHx, meds since we last saw her. Denies any recent illness or fever or infectious sxs.   Past Medical History:  Past Medical History  Diagnosis Date  . Hypertension   . Hypothyroidism   . GERD (gastroesophageal reflux disease)   . Headache   . Anxiety     Past Surgical History:  Past Surgical History  Procedure Laterality Date  . Cesarean section    . Spleenectomy    . Laparoscopic tubal ligation  12/27/2011    Procedure: LAPAROSCOPIC TUBAL LIGATION;  Surgeon: Juluis Mire, MD;  Location: [facility];  Service: Gynecology;  Laterality: Bilateral;    Family History:  Family History  Problem Relation Age of Onset  . Heart disease Mother   . Cancer Maternal Aunt     Social History:  reports that she has never smoked. She does not have any smokeless tobacco history on file. She reports that she does not drink alcohol or use illicit drugs.  Allergies: No Known Allergies  Medications: calcium carbonate (OS-CAL) 600 MG TABS (Taking) Sig - Route: Take 600 mg by mouth daily. - Oral Class: Historical Med cetirizine (ZYRTEC) 10 MG tablet (Taking) Sig - Route: Take 10 mg by mouth daily. - Oral Class: Historical Med cholecalciferol (VITAMIN D) 1000 UNITS tablet (Taking) Sig - Route: Take 2,000 Units by mouth daily. - Oral Class: Historical Med cimetidine (TAGAMET) 200 MG tablet (Taking) Sig - Route: Take 200 mg by mouth daily as needed. For acid reflux - Oral Class: Historical Med Number of times this order has been changed since signing: 2 Order [address] Cyanocobalamin (VITAMIN B-12 PO) (Taking) Sig - Route: Take by mouth every morning. - Oral  Class: Historical Med hydrochlorothiazide (MICROZIDE) 12.5 MG capsule (Taking) Sig - Route: Take 12.5 mg by mouth every morning. - Oral Class: Historical Med Number of times this order has been changed since signing: 3 Order [address] ibuprofen (ADVIL,MOTRIN) 200 MG tablet (Taking) Sig - Route: Take 400 mg by mouth every 6 (six) hours as needed. For pain - Oral Class: Historical Med Number of times this order has been changed since signing: 2 Order [address] levothyroxine (SYNTHROID, LEVOTHROID) 100 MCG tablet (Taking) Sig - Route: Take 100 mcg by mouth every morning. - Oral Class: Historical Med Number of times this order has been changed since signing: 2 Order [address] naproxen sodium (ANAPROX) 220 MG tablet (Taking) Sig - Route: Take 220 mg by mouth 2 (two) times daily as needed. For migraines. - Oral Class: Historical Med vitamin A 8000 UNIT capsule (Taking) Sig - Route: Take 8,000 Units by mouth daily   Please HPI for pertinent positives, otherwise complete 10 system ROS negative.  Physical Exam: Blood pressure 124/89, pulse 70, temperature 98.6 F (37 C), temperature source Oral, resp. rate 18, last menstrual period 08/11/2012, SpO2 100.00%. There is no weight on file to calculate BMI.   General Appearance:  Alert, cooperative, no distress, appears stated age  Head:  Normocephalic, without obvious abnormality, atraumatic  ENT: Unremarkable  Neck: Supple, symmetrical, trachea midline, no adenopathy, thyroid: not enlarged, symmetric, no tenderness/mass/nodules  Lungs:  Clear to auscultation bilaterally, no w/r/r, respirations unlabored without use of accessory muscles.  Heart:  Regular rate and rhythm, S1, S2 normal, no murmur, rub or gallop. Carotids 2+ without bruit.  Abdomen:   Soft, non-tender, non distended. Bowel sounds active all four quadrants,  no masses, no organomegaly.  Pulses: 2+ and symmetric femoral and pedal  Neurologic: Normal affect, no gross deficits.   Results for  orders placed during the hospital encounter of 09/09/12 (from the past 48 hour(s))  APTT     Status: Abnormal   Collection Time    09/09/12  7:45 AM      Result Value Range   aPTT 21 (*) 24 - 37 seconds  BASIC METABOLIC PANEL     Status: Abnormal   Collection Time    09/09/12  7:45 AM      Result Value Range   Sodium 133 (*) 135 - 145 mEq/L   Potassium 3.7  3.5 - 5.1 mEq/L   Chloride 97  96 - 112 mEq/L   CO2 26  19 - 32 mEq/L   Glucose, Bld 116 (*) 70 - 99 mg/dL   BUN 11  6 - 23 mg/dL   Creatinine, Ser 1.91  0.50 - 1.10 mg/dL   Calcium 8.9  8.4 - 47.8 mg/dL   GFR calc non Af Amer >90  >90 mL/min   GFR calc Af Amer >90  >90 mL/min   Comment:            The eGFR has been calculated     using the CKD EPI equation.     This calculation has not been     validated in all clinical     situations.     eGFR's persistently     <90 mL/min signify     possible Chronic Kidney Disease.  CBC WITH DIFFERENTIAL     Status: Abnormal   Collection Time    09/09/12  7:45 AM      Result Value Range   WBC 11.1 (*) 4.0 - 10.5 K/uL   RBC 4.90  3.87 - 5.11 MIL/uL   Hemoglobin 13.6  12.0 - 15.0 g/dL   HCT 29.5  62.1 - 30.8 %   MCV 83.3  78.0 - 100.0 fL   MCH 27.8  26.0 - 34.0 pg   MCHC 33.3  30.0 - 36.0 g/dL   RDW 65.7  84.6 - 96.2 %   Platelets 383  150 - 400 K/uL   Neutrophils Relative 70  43 - 77 %   Neutro Abs 7.7  1.7 - 7.7 K/uL   Lymphocytes Relative 19  12 - 46 %   Lymphs Abs 2.1  0.7 - 4.0 K/uL   Monocytes Relative 8  3 - 12 %   Monocytes Absolute 0.8  0.1 - 1.0 K/uL   Eosinophils Relative 3  0 - 5 %   Eosinophils Absolute 0.4  0.0 - 0.7 K/uL   Basophils Relative 1  0 - 1 %   Basophils Absolute 0.1  0.0 - 0.1 K/uL  HCG, SERUM, QUALITATIVE     Status: None   Collection Time    09/09/12  7:45 AM      Result Value Range   Preg, Serum NEGATIVE  NEGATIVE   Comment:            THE SENSITIVITY OF THIS     METHODOLOGY IS >10 mIU/mL.  PROTIME-INR     Status: None   Collection Time  09/09/12  7:45 AM      Result Value Range   Prothrombin Time 12.0  11.6 - 15.2 seconds   INR 0.89  0.00 - 1.49   No results found.  Assessment/Plan Symptomatic uterine fibroids. Plan for [place] today. Reviewed procedure, risks, complications. Reviewed plans for overnight observation for pain control. Labs reviewed, WBC sl elevated at 11.1, but no infectious issues/sxs Consent signed in chart  Brayton El PA-C 09/09/2012, 8:57 AM

## 2012-09-10 MED ORDER — HYDROCODONE-ACETAMINOPHEN 5-325 MG PO TABS
1.0000 | ORAL_TABLET | Freq: Once | ORAL | Status: AC
  Administered 2012-09-10: 1 via ORAL
  Filled 2012-09-10: qty 1

## 2012-09-10 MED ORDER — PROMETHAZINE HCL 12.5 MG PO TABS
12.5000 mg | ORAL_TABLET | Freq: Once | ORAL | Status: AC
  Administered 2012-09-10: 12.5 mg via ORAL
  Filled 2012-09-10: qty 1

## 2012-09-10 NOTE — Discharge Summary (Signed)
Physician Discharge Summary  Patient ID: Christine Rush MRN: [id number] DOB/AGE: 44-Aug-1972 44 y.o.  Admit date: 09/09/2012 Discharge date: 09/10/2012  Admission Diagnoses: Symptomatic uterine fibroids; Menorrhagia  Discharge Diagnoses: Embolized Uterine Artery (Bilateral)  Active problems: HTN; Hypothyroidism; GERD  Discharged Condition: stable; improved  Hospital Course: Pt with long history of painful uterine fibroids and menorrhagia. Consulted Dr Grace Isaac regarding Uterine Artery Embolization; procedure performed 09/09/12. Procedure was without complication. Overnight stay was uneventful. Pt complains of slight headache and no other complaints. Slept well, eating well without Nausea or vomiting. Urinating well- yellow and clear. Passing gas. Ambulating without assistance. Plan for dc today. I have seen and examined pt and reported to Dr Grace Isaac. Pt will return to clinic for f/u with Dr Grace Isaac in 2-4 weeks. Resume all home meds. Rx: Ibu 600 mg #30 Vicodin 5/500 mg #20 Colace 100 mg #20 Phenergan 25 mg #20  Consults: None  Significant Diagnostic Studies: Uterine artery arteriogram  Treatments: Bilateral Uterine Artery Embolization  Discharge Exam: Blood pressure 116/72, pulse 70, temperature 98 F (36.7 C), temperature source Oral, resp. rate 21, height 5\' 1"  (1.549 m), weight 184 lb 1.4 oz (83.5 kg), last menstrual period 08/11/2012, SpO2 99.00%.  PE:  A/O Appropriate Afeb; VSS Good UOP Heart: RRR Lungs: CTA Abd: soft; +BS; NT Rt groin: NT; no bleeding; no hematoma Extr: 2+ pulses Ambulating well  Results for orders placed during the hospital encounter of 09/09/12  APTT      Result Value Range   aPTT 21 (*) 24 - 37 seconds  BASIC METABOLIC PANEL      Result Value Range   Sodium 133 (*) 135 - 145 mEq/L   Potassium 3.7  3.5 - 5.1 mEq/L   Chloride 97  96 - 112 mEq/L   CO2 26  19 - 32 mEq/L   Glucose, Bld 116 (*) 70 - 99 mg/dL   BUN 11  6 - 23 mg/dL   Creatinine,  Ser 1.30  0.50 - 1.10 mg/dL   Calcium 8.9  8.4 - 86.5 mg/dL   GFR calc non Af Amer >90  >90 mL/min   GFR calc Af Amer >90  >90 mL/min  CBC WITH DIFFERENTIAL      Result Value Range   WBC 11.1 (*) 4.0 - 10.5 K/uL   RBC 4.90  3.87 - 5.11 MIL/uL   Hemoglobin 13.6  12.0 - 15.0 g/dL   HCT 78.4  69.6 - 29.5 %   MCV 83.3  78.0 - 100.0 fL   MCH 27.8  26.0 - 34.0 pg   MCHC 33.3  30.0 - 36.0 g/dL   RDW 28.4  13.2 - 44.0 %   Platelets 383  150 - 400 K/uL   Neutrophils Relative 70  43 - 77 %   Neutro Abs 7.7  1.7 - 7.7 K/uL   Lymphocytes Relative 19  12 - 46 %   Lymphs Abs 2.1  0.7 - 4.0 K/uL   Monocytes Relative 8  3 - 12 %   Monocytes Absolute 0.8  0.1 - 1.0 K/uL   Eosinophils Relative 3  0 - 5 %   Eosinophils Absolute 0.4  0.0 - 0.7 K/uL   Basophils Relative 1  0 - 1 %   Basophils Absolute 0.1  0.0 - 0.1 K/uL  HCG, SERUM, QUALITATIVE      Result Value Range   Preg, Serum NEGATIVE  NEGATIVE  PROTIME-INR      Result Value Range   Prothrombin Time  12.0  11.6 - 15.2 seconds   INR 0.89  0.00 - 1.49    Disposition: Symptomatic uterine fibroids; Menorrhagia Bilat Uterine fibroid embolization performed in IR 2/11 with Dr Grace Isaac without complication Overnight stay uneventful Pt doing well now. Resume all home meds Rx: Ibu 600 mg; Vicodin 5/500 mg; Colace 100 mg; Phenergan 25 mg Pt has good understanding of dc instructions I have seen and examined pt; reported to Dr Grace Isaac. DC now to home Call [phone] if problems Clinic will call pt for 2-4 week f/u with Dr Grace Isaac Pt aware and agreeable.   Discharge Orders   Future Orders Complete By Expires     IR Radiologist Eval & Mgmt  10/08/2012 11/08/2013    Questions:      Is the patient pregnant?:  No    Preferred Imaging Location?:  [facility]    Reason for exam:  2-4 week f/u post [place] with Grace Isaac    Call MD for:  persistant nausea and vomiting  As directed     Call MD for:  redness, tenderness, or signs of infection (pain,  swelling, redness, odor or green/yellow discharge around incision site)  As directed     Call MD for:  severe uncontrolled pain  As directed     Call MD for:  temperature >100.4  As directed     Diet - low sodium heart healthy  As directed     Discharge instructions  As directed     Comments:      Tammy at clinic will call pt with follow up appt date and time with Dr Grace Isaac; [phone]; restful x 3 days; resume all home meds    Discharge wound care:  As directed     Comments:      May take shower today with bandage on; then remove bandage and replace with band aid today and daily x 4-5 days    Driving Restrictions  As directed     Comments:      No driving til weekend    Increase activity slowly  As directed     Lifting restrictions  As directed     Comments:      No lifting over 10 lbs x 1 week        Medication List    TAKE these medications       calcium carbonate 600 MG Tabs  Commonly known as:  OS-CAL  Take 600 mg by mouth daily.     cetirizine 10 MG tablet  Commonly known as:  ZYRTEC  Take 10 mg by mouth daily.     cholecalciferol 1000 UNITS tablet  Commonly known as:  VITAMIN D  Take 2,000 Units by mouth daily.     cimetidine 200 MG tablet  Commonly known as:  TAGAMET  Take 200 mg by mouth daily as needed. For acid reflux     glucosamine-chondroitin 500-400 MG tablet  Take 1 tablet by mouth every morning.     hydrochlorothiazide 12.5 MG capsule  Commonly known as:  MICROZIDE  Take 12.5 mg by mouth every morning.     ibuprofen 200 MG tablet  Commonly known as:  ADVIL,MOTRIN  Take 400 mg by mouth every 6 (six) hours as needed. For pain     levothyroxine 100 MCG tablet  Commonly known as:  SYNTHROID, LEVOTHROID  Take 100 mcg by mouth every morning.     LUTEIN PO  Take 1 capsule by mouth daily.     naproxen  sodium 220 MG tablet  Commonly known as:  ANAPROX  Take 220 mg by mouth 2 (two) times daily as needed. For migraines.     vitamin A 8000 UNIT capsule   Take 8,000 Units by mouth daily.     VITAMIN B-12 PO  Take by mouth every morning.         SignedRalene Muskrat A 09/10/2012, 9:47 AM

## 2012-09-10 NOTE — Progress Notes (Signed)
Patient discharged home, all discharge medications and instructions reviewed and questions answered. Patient to be assisted to vehicle by wheelchair.  

## 2012-10-07 ENCOUNTER — Ambulatory Visit
Admission: RE | Admit: 2012-10-07 | Discharge: 2012-10-07 | Disposition: A | Discharge status: Home / Self Care | Payer: BC Managed Care – PPO | Source: Ambulatory Visit | Attending: Radiology | Admitting: Radiology

## 2012-10-07 DIAGNOSIS — D219 Benign neoplasm of connective and other soft tissue, unspecified: Secondary | ICD-10-CM

## 2012-12-18 ENCOUNTER — Telehealth: Payer: Self-pay | Admitting: Radiology

## 2012-12-18 NOTE — Telephone Encounter (Signed)
3 mo f/u [place] phone call.  Left message requesting patient call for status update.  Makaya Juneau Carmell [place], Charity fundraiser

## 2013-01-29 ENCOUNTER — Other Ambulatory Visit (HOSPITAL_COMMUNITY): Payer: Self-pay | Admitting: Interventional Radiology

## 2013-01-29 DIAGNOSIS — D219 Benign neoplasm of connective and other soft tissue, unspecified: Secondary | ICD-10-CM

## 2013-03-10 ENCOUNTER — Other Ambulatory Visit: Payer: BC Managed Care – PPO

## 2013-03-31 ENCOUNTER — Ambulatory Visit
Admission: RE | Admit: 2013-03-31 | Discharge: 2013-03-31 | Disposition: A | Discharge status: Home / Self Care | Payer: BC Managed Care – PPO | Source: Ambulatory Visit | Attending: Interventional Radiology | Admitting: Interventional Radiology

## 2013-03-31 VITALS — BP 188/83 | HR 80 | Temp 97.9°F | Resp 14

## 2013-03-31 DIAGNOSIS — D219 Benign neoplasm of connective and other soft tissue, unspecified: Secondary | ICD-10-CM

## 2013-03-31 NOTE — Progress Notes (Signed)
LMP:  03/23/2013  Frequency:  Q 4 weeks.  Duration:  6-7 days.  Decreased flow with minimal small clots (as compared to pre [place].)  Occasional spotting  Energy improving.  Continues to take slow release iron supplements.     Taneya Conkel Carmell [place], RN 03/31/2013 2:44 PM

## 2013-05-02 IMAGING — XA IR EMBO TUMOR ORGAN ISCHEMIA INFARCT INC GUIDE ROADMAPPING
11 of 13 series · 13 of 24 positions shown · IV contrast (IODINE)
Comparison: none

INDICATION: Symptomatic uterine fibroids

[Series 3: dsa check · 1 of 18 frames shown (1 of 9)]
[frame 3/18]
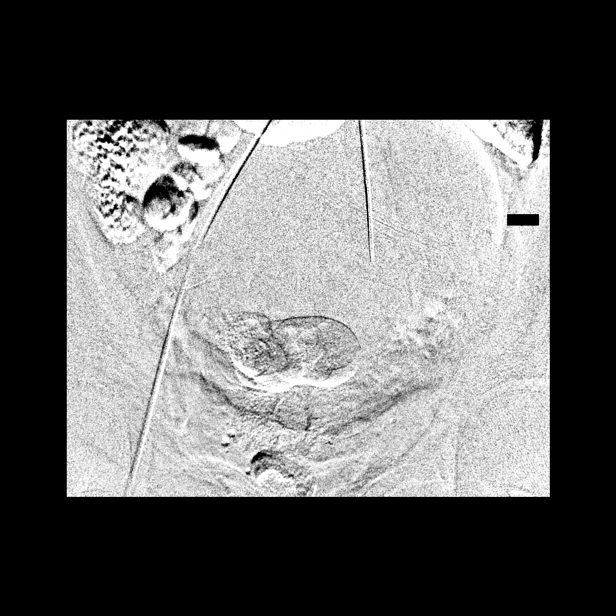

[Series 4: dsa check · 1 of 23 frames shown (2 of 9)]
[frame 1/23]
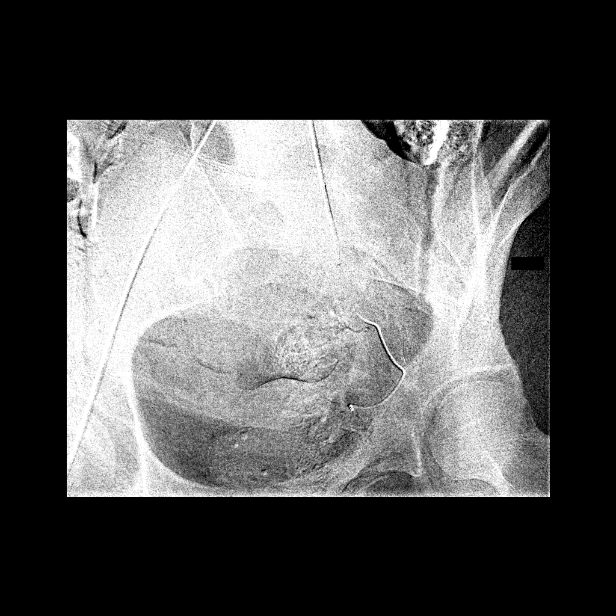

[Series 5: dsa check · 1 of 23 frames shown (3 of 9)]
[frame 12/23]
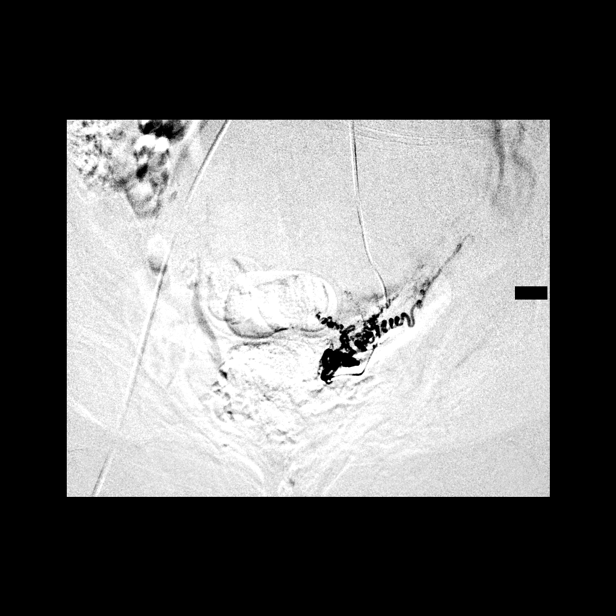

[Series 6: dsa check · 1 of 20 frames shown (4 of 9)]
[frame 9/20]
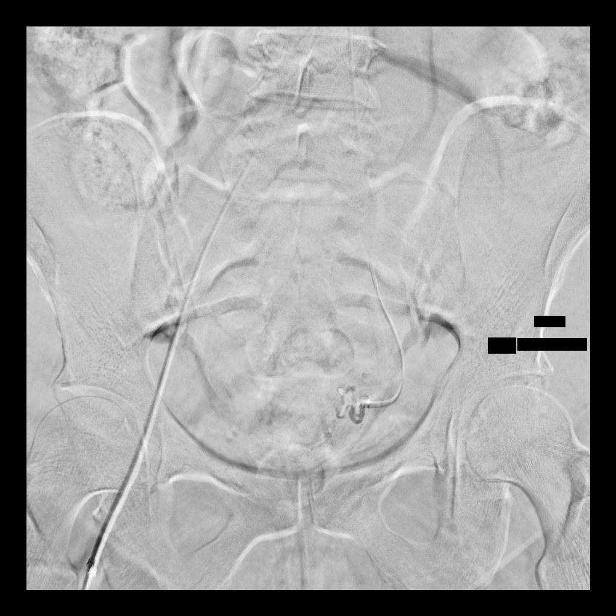

[Series 8: dsa check · 1 of 7 frames shown (5 of 9)]
[frame 6/7]
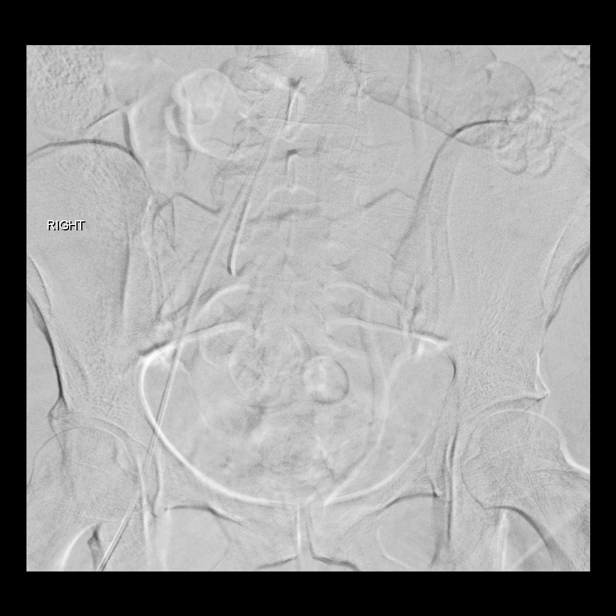

[Series 9: dsa check · 1 of 13 frames shown (6 of 9)]
[frame 12/13]
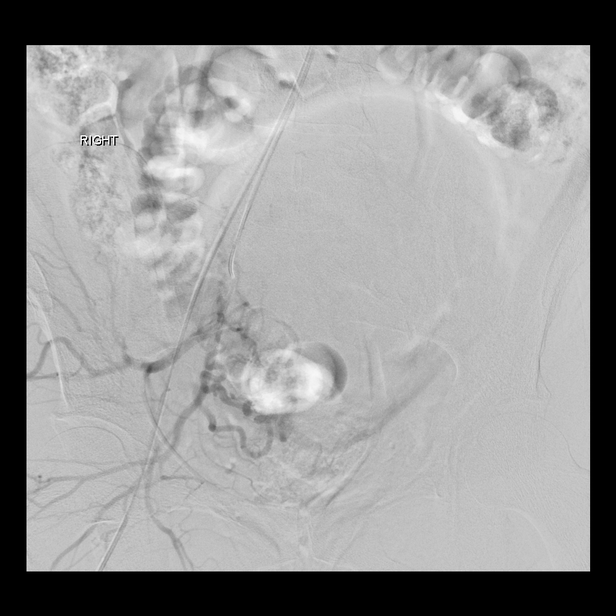

[Series 11: dsa check · 2 of 16 frames shown (7 of 9)]
[frame 3/16]
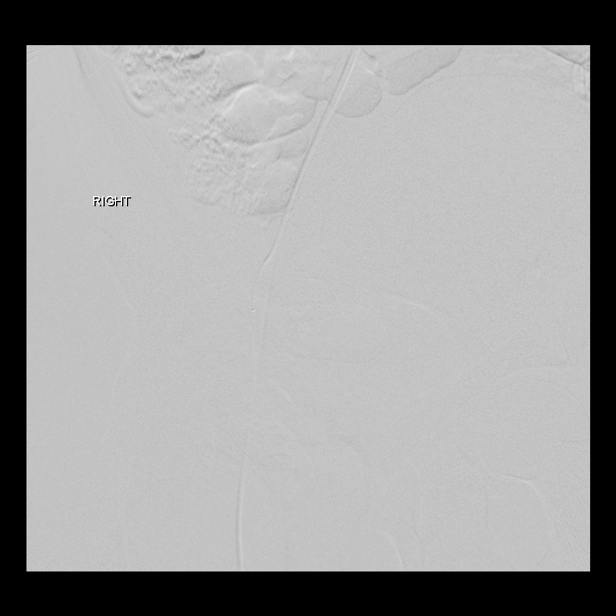
[frame 14/16]
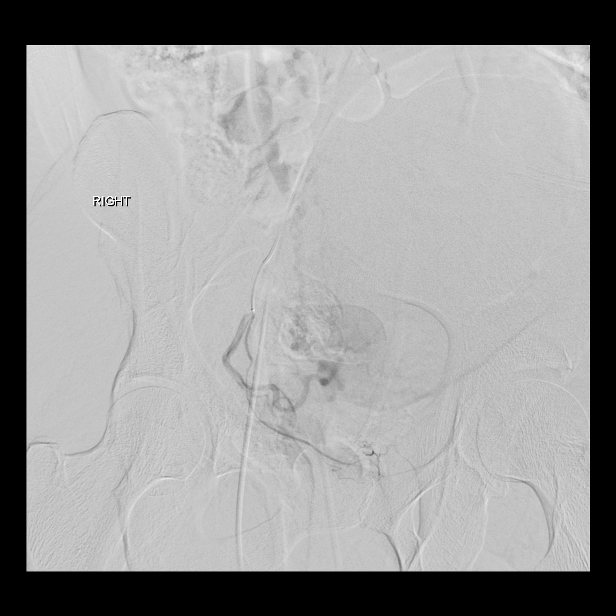

[Series 12: dsa check · 1 of 15 frames shown (8 of 9)]
[frame 14/15]
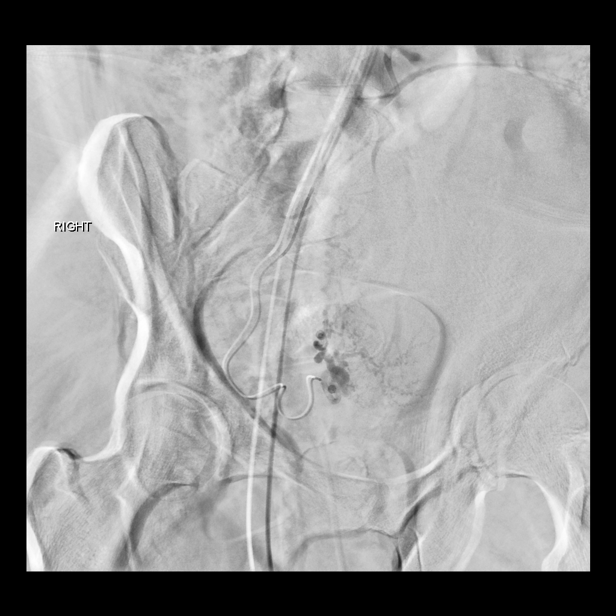

[Series 14: dsa check · 1 of 15 frames shown (9 of 9)]
[frame 3/15]
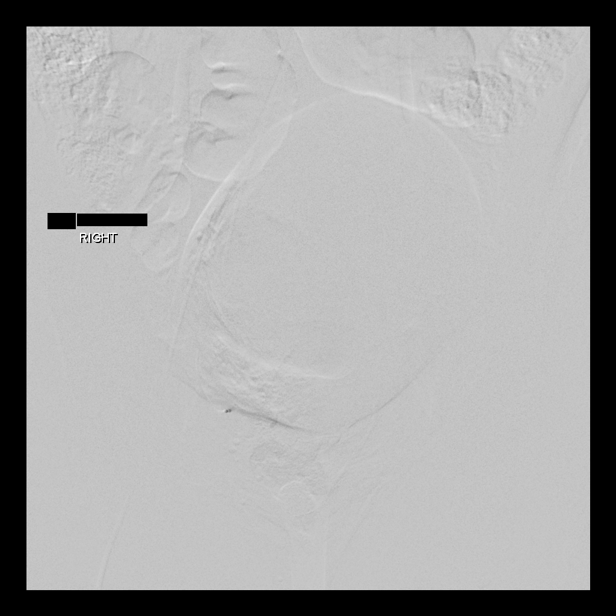

[Series 100: ir embo tumor organ ischemia infarct inc · 2 of 8 slices shown (1 of 2)]
[im 1/8]
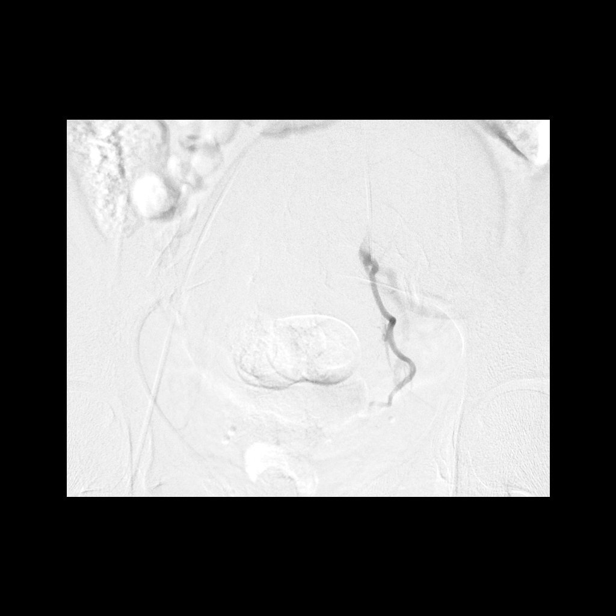
[im 6/8]
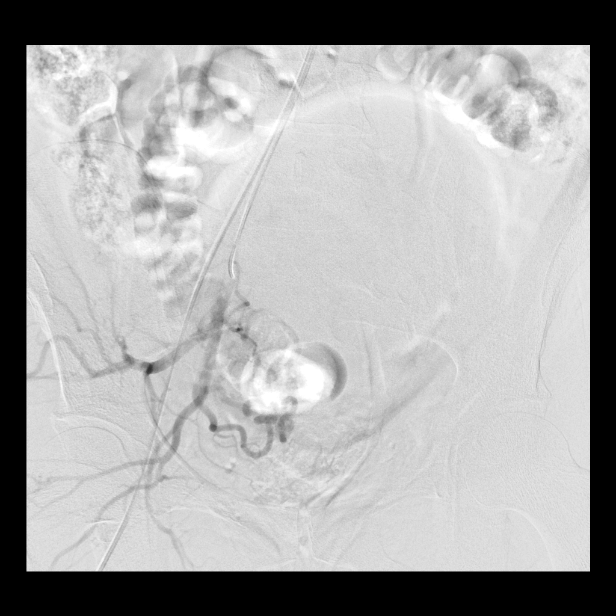

[Series 300: ir embo tumor organ ischemia infarct inc · 1 of 2 slices shown (2 of 2)]
[im 2/2]
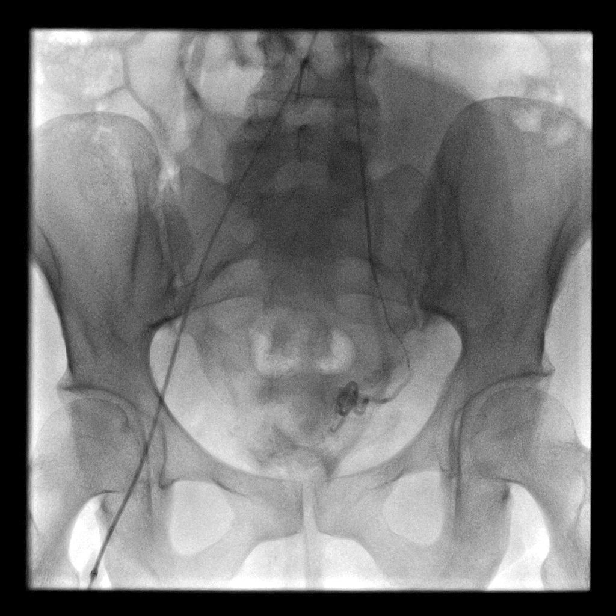

[13 of 24 positions shown; findings below may reference images not displayed]

UTERINE FIBROID EMBOLIZATION

Medications:  Fentanyl 100 mcg IV; Versed 6 mg IV; Toradol 30 mg
IV; Zofran 4 mg IV; Phenergan 25 mg IV; Ancef 1 gm IV; The
antibiotic was administered within one hour of the procedure

Sedation time: 90 minutes

Contrast volume: 95 mL 5mnipaque-ATT

Fluoroscopy time:  26 minutes

Complications: None immediate

PROCEDURE / FINDINGS:

Informed consent was obtained from the patient following
explanation of the procedure, risks, benefits and alternatives.
The patient understands, agrees and consents for the procedure.
All questions were addressed.  A time out was performed prior to
the initiation of the procedure.  Maximal barrier sterile technique
utilized including caps, mask, sterile gowns, sterile gloves, large
sterile drape, hand hygiene, and Betadine prep.

The right femoral head was marked fluoroscopically.  Under sterile
conditions and local anesthesia, the right common femoral artery
access was performed with a micropuncture needle.  Under direct
ultrasound guidance, the right common femoral was accessed with a
micropuncture kit.  An ultrasound image was saved for documentation
purposes. This allowed for placement of a 5-French vascular sheath.
A limited arteriogram was performed through the side arm of the
sheath confirming appropriate access within the right common
femoral artery.  Utilizing an Omniflush catheter, a stiff glide
wire was advanced into the contralateral left common femoral
artery.  Under intermittent fluoroscopic guidance, the Omniflush
catheter was exchanged for Lucila Scholten uterine catheter.

The 5-Es Tiger uterine catheter was utilized to select the
contralateral left internal iliac artery.  Selective left internal
iliac angiogram was performed.  The tortuous left uterine artery
was identified.  Selective catheterization was performed of the
left uterine artery with a microcatheter and micro guide wire.  A
selective left uterine angiogram was performed.  This demonstrated
patency of the left uterine artery.  Mild diffuse hypervascularity
of the enlarged fibroid uterus.  Access was adequate for
embolization.  For embolization, 3 vials of 500 - 700 micron
Embospheres and [DATE] vial of 700-900 micron Embospheres were
injected into the left uterine artery.  Post embolization angiogram
confirms complete stasis of the left uterine vascular territory.
Microcatheter was removed.

The Lapchenko uterine catheter was retracted and utilized to select
the right internal iliac artery.  Selective right internal iliac
angiogram was performed.  The patent right uterine artery was
identified.  For selective catheterization, the micro catheter and
guidewire were utilized to select the right uterine artery.
Selective right uterine angiogram was performed.  This demonstrated
patency of the right uterine artery.  Catheter position was safe
for embolization.  Embolization was performed to complete stasis
with injection of 2 vials of 500-700 micron Embospheres. Post
embolization angiogram confirms complete stasis of the right
uterine vascular territory.

At this point, all wires, catheters and sheaths were removed from
the patient.  Hemostasis was achieved at the right groin access
site with manual compression.  The patient tolerated the procedure
well without immediate post procedural complication.
IMPRESSION: Successful bilateral uterine artery embolization (U F E).

## 2013-06-09 ENCOUNTER — Other Ambulatory Visit: Payer: Self-pay | Admitting: Obstetrics and Gynecology

## 2013-06-09 DIAGNOSIS — Z1231 Encounter for screening mammogram for malignant neoplasm of breast: Secondary | ICD-10-CM

## 2013-07-07 ENCOUNTER — Ambulatory Visit: Payer: BC Managed Care – PPO

## 2014-05-31 ENCOUNTER — Encounter (HOSPITAL_COMMUNITY): Payer: Self-pay

## 2014-08-30 ENCOUNTER — Other Ambulatory Visit: Payer: Self-pay | Admitting: Obstetrics and Gynecology

## 2014-08-31 ENCOUNTER — Other Ambulatory Visit: Payer: Self-pay | Admitting: Obstetrics and Gynecology

## 2014-08-31 DIAGNOSIS — R1011 Right upper quadrant pain: Secondary | ICD-10-CM

## 2014-08-31 LAB — CYTOLOGY - PAP

## 2014-09-07 ENCOUNTER — Other Ambulatory Visit: Payer: Self-pay | Admitting: Obstetrics and Gynecology

## 2014-09-07 ENCOUNTER — Ambulatory Visit (HOSPITAL_COMMUNITY)
Admission: RE | Admit: 2014-09-07 | Discharge: 2014-09-07 | Disposition: A | Discharge status: Home / Self Care | Payer: BLUE CROSS/BLUE SHIELD | Source: Ambulatory Visit | Attending: Obstetrics and Gynecology | Admitting: Obstetrics and Gynecology

## 2014-09-07 DIAGNOSIS — R1011 Right upper quadrant pain: Secondary | ICD-10-CM

## 2014-10-15 ENCOUNTER — Encounter: Payer: Self-pay | Admitting: Gastroenterology

## 2014-12-06 ENCOUNTER — Ambulatory Visit: Payer: BLUE CROSS/BLUE SHIELD | Admitting: Gastroenterology

## 2019-05-22 ENCOUNTER — Other Ambulatory Visit: Payer: Self-pay

## 2019-05-22 DIAGNOSIS — Z20822 Contact with and (suspected) exposure to covid-19: Secondary | ICD-10-CM

## 2019-05-24 LAB — NOVEL CORONAVIRUS, NAA: SARS-CoV-2, NAA: NOT DETECTED

## 2019-05-28 ENCOUNTER — Telehealth: Payer: Self-pay | Admitting: General Practice

## 2019-05-28 NOTE — Telephone Encounter (Signed)
Patient informed of negative covid result. She verbalized understanding.

## 2019-06-10 ENCOUNTER — Encounter (HOSPITAL_COMMUNITY): Payer: Self-pay | Admitting: Emergency Medicine

## 2019-06-10 ENCOUNTER — Other Ambulatory Visit: Payer: Self-pay

## 2019-06-10 ENCOUNTER — Emergency Department (HOSPITAL_COMMUNITY)
Admission: EM | Admit: 2019-06-10 | Discharge: 2019-06-10 | Disposition: A | Discharge status: Home / Self Care | Payer: BC Managed Care – PPO | Attending: Emergency Medicine | Admitting: Emergency Medicine

## 2019-06-10 DIAGNOSIS — I10 Essential (primary) hypertension: Secondary | ICD-10-CM | POA: Diagnosis not present

## 2019-06-10 DIAGNOSIS — Z30432 Encounter for removal of intrauterine contraceptive device: Secondary | ICD-10-CM

## 2019-06-10 DIAGNOSIS — N939 Abnormal uterine and vaginal bleeding, unspecified: Secondary | ICD-10-CM | POA: Diagnosis not present

## 2019-06-10 DIAGNOSIS — Z79899 Other long term (current) drug therapy: Secondary | ICD-10-CM | POA: Diagnosis not present

## 2019-06-10 DIAGNOSIS — R112 Nausea with vomiting, unspecified: Secondary | ICD-10-CM | POA: Insufficient documentation

## 2019-06-10 DIAGNOSIS — E039 Hypothyroidism, unspecified: Secondary | ICD-10-CM | POA: Diagnosis not present

## 2019-06-10 DIAGNOSIS — R102 Pelvic and perineal pain: Secondary | ICD-10-CM | POA: Diagnosis present

## 2019-06-10 LAB — COMPREHENSIVE METABOLIC PANEL
ALT: 21 U/L (ref 0–44)
AST: 22 U/L (ref 15–41)
Albumin: 3.6 g/dL (ref 3.5–5.0)
Alkaline Phosphatase: 50 U/L (ref 38–126)
Anion gap: 10 (ref 5–15)
BUN: 10 mg/dL (ref 6–20)
CO2: 25 mmol/L (ref 22–32)
Calcium: 9.3 mg/dL (ref 8.9–10.3)
Chloride: 100 mmol/L (ref 98–111)
Creatinine, Ser: 0.76 mg/dL (ref 0.44–1.00)
GFR calc Af Amer: >60 mL/min (ref >60)
GFR calc non Af Amer: >60 mL/min (ref >60)
Glucose, Bld: 139 mg/dL — ABNORMAL HIGH (ref 70–99)
Potassium: 4.1 mmol/L (ref 3.5–5.1)
Sodium: 135 mmol/L (ref 135–145)
Total Bilirubin: 0.8 mg/dL (ref 0.3–1.2)
Total Protein: 6.7 g/dL (ref 6.5–8.1)

## 2019-06-10 LAB — URINALYSIS, ROUTINE W REFLEX MICROSCOPIC
Bacteria, UA: NONE SEEN
Bilirubin Urine: NEGATIVE
Glucose, UA: NEGATIVE mg/dL
Ketones, ur: 5 mg/dL — AB
Leukocytes,Ua: NEGATIVE
Nitrite: NEGATIVE
Protein, ur: NEGATIVE mg/dL
Specific Gravity, Urine: 1.021 (ref 1.005–1.030)
pH: 5 (ref 5.0–8.0)

## 2019-06-10 LAB — I-STAT BETA HCG BLOOD, ED (MC, WL, AP ONLY): I-stat hCG, quantitative: <5 m[IU]/mL (ref <5)

## 2019-06-10 LAB — CBC
HCT: 38.6 % (ref 36.0–46.0)
Hemoglobin: 13.5 g/dL (ref 12.0–15.0)
MCH: 33.3 pg (ref 26.0–34.0)
MCHC: 35 g/dL (ref 30.0–36.0)
MCV: 95.1 fL (ref 80.0–100.0)
Platelets: 324 10*3/uL (ref 150–400)
RBC: 4.06 MIL/uL (ref 3.87–5.11)
RDW: 11.9 % (ref 11.5–15.5)
WBC: 12.5 10*3/uL — ABNORMAL HIGH (ref 4.0–10.5)
nRBC: 0 % (ref 0.0–0.2)

## 2019-06-10 LAB — WET PREP, GENITAL
Clue Cells Wet Prep HPF POC: NONE SEEN
Sperm: NONE SEEN
Trich, Wet Prep: NONE SEEN
Yeast Wet Prep HPF POC: NONE SEEN

## 2019-06-10 LAB — LIPASE, BLOOD: Lipase: 24 U/L (ref 11–51)

## 2019-06-10 LAB — HIV ANTIBODY (ROUTINE TESTING W REFLEX): HIV Screen 4th Generation wRfx: NONREACTIVE

## 2019-06-10 MED ORDER — SODIUM CHLORIDE 0.9% FLUSH: 3.0000 mL | Freq: Once | INTRAVENOUS | Status: DC

## 2019-06-10 NOTE — ED Provider Notes (Signed)
[facility] Provider Note   CSN: [id number] Arrival date & time: 06/10/19  0439     History   Chief Complaint Chief Complaint  Patient presents with  . Abdominal Pain    HPI Christine Rush is a 48 y.o. female.     The history is provided by the patient and medical records. No language interpreter was used.  Abdominal Pain    48 year old female with hx of fibroid, presenting for evaluation of vaginal bleeding and pelvic pain.  Patient report 4 days ago she had an a gush of blood per vaginal area without any significant pain.  Last night she did experiencing pain to her right lower pelvic region.  She described pain as a cramping sensation, sudden, intense, with associated vaginal bleeding.  Pain has since subsided and rates pain as 3 out of 10 currently.  However she mention her pain was persistent throughout the night last night, having difficulty sleeping.  She tries taking warm bath, heating pad, NSAIDs, as well as Benadryl without relief.  She report had an IUD placed 9 months ago.  She reported history of uterine fibroid requiring surgical management.  She notes mild nausea without vomiting.  She denies any fever or chills.  Last sexual activities was 2 months ago.   Past Medical History:  Diagnosis Date  . Anxiety   . Fibroids   . GERD (gastroesophageal reflux disease)   . Headache(784.0)   . Hypertension   . Hypothyroidism     Patient Active Problem List   Diagnosis Date Noted  . Fibroids 09/09/2012    Past Surgical History:  Procedure Laterality Date  . CESAREAN SECTION    . LAPAROSCOPIC TUBAL LIGATION  12/27/2011   Procedure: LAPAROSCOPIC TUBAL LIGATION;  Surgeon: Darlyn Chamber, MD;  Location: [facility];  Service: Gynecology;  Laterality: Bilateral;  . spleenectomy       OB History    Gravida  1   Para  1   Term  1   Preterm  0   AB  0   Living  1     SAB  0   TAB  0   Ectopic  0   Multiple  0   Live  Births               Home Medications    Prior to Admission medications   Medication Sig Start Date End Date Taking? Authorizing Provider  calcium carbonate (OS-CAL) 600 MG TABS Take 600 mg by mouth daily.    [provider]  cetirizine (ZYRTEC) 10 MG tablet Take 10 mg by mouth daily.    [provider]  cholecalciferol (VITAMIN D) 1000 UNITS tablet Take 2,000 Units by mouth daily.    [provider]  cimetidine (TAGAMET) 200 MG tablet Take 200 mg by mouth daily as needed. For acid reflux    [provider]  Cyanocobalamin (VITAMIN B-12 PO) Take by mouth every morning.    [provider]  glucosamine-chondroitin 500-400 MG tablet Take 1 tablet by mouth every morning.    [provider]  hydrochlorothiazide (MICROZIDE) 12.5 MG capsule Take 12.5 mg by mouth every morning.     [provider]  ibuprofen (ADVIL,MOTRIN) 200 MG tablet Take 400 mg by mouth every 6 (six) hours as needed. For pain    [provider]  levothyroxine (SYNTHROID, LEVOTHROID) 100 MCG tablet Take 100 mcg by mouth every morning.  [provider]  LUTEIN PO Take 1 capsule by mouth daily.    [provider]  naproxen sodium (ANAPROX) 220 MG tablet Take 220 mg by mouth 2 (two) times daily as needed. For migraines.    [provider]  vitamin A 8000 UNIT capsule Take 8,000 Units by mouth daily.      [provider]    Family History Family History  Problem Relation Age of Onset  . Heart disease Mother   . Cancer Maternal Aunt     Social History Social History   Tobacco Use  . Smoking status: Never Smoker  Substance Use Topics  . Alcohol use: No  . Drug use: No     Allergies   Patient has no known allergies.   Review of Systems Review of Systems  Gastrointestinal: Positive for abdominal pain.  All other systems reviewed and are negative.    Physical Exam Updated Vital Signs BP 127/85 (BP  Location: Left Arm)   Pulse 64   Temp 98.2 F (36.8 C) (Oral)   Resp 16   LMP 05/14/2019 (Approximate)   SpO2 99%   Physical Exam Vitals signs and nursing note reviewed.  Constitutional:      General: She is not in acute distress.    Appearance: She is well-developed.  HENT:     Head: Atraumatic.  Eyes:     Conjunctiva/sclera: Conjunctivae normal.  Neck:     Musculoskeletal: Neck supple.  Cardiovascular:     Rate and Rhythm: Normal rate and regular rhythm.  Pulmonary:     Effort: Pulmonary effort is normal.     Breath sounds: Normal breath sounds.  Abdominal:     General: Abdomen is flat.     Palpations: Abdomen is soft.     Tenderness: There is no abdominal tenderness.  Genitourinary:    Comments: Chaperone present during exam.  No inguinal lymphadenopathy or inguinal hernia noted.  Normal external genitalia.  Blood noted in vaginal vault.  Mirena IUD protruding from cervical os, tender to palpation.  On bimanual examination, no adnexal tenderness. Skin:    Findings: No rash.  Neurological:     Mental Status: She is alert.      ED Treatments / Results  Labs (all labs ordered are listed, but only abnormal results are displayed) Labs Reviewed  WET PREP, GENITAL - Abnormal; Notable for the following components:      Result Value   WBC, Wet Prep HPF POC MANY (*)    All other components within normal limits  COMPREHENSIVE METABOLIC PANEL - Abnormal; Notable for the following components:   Glucose, Bld 139 (*)    All other components within normal limits  CBC - Abnormal; Notable for the following components:   WBC 12.5 (*)    All other components within normal limits  URINALYSIS, ROUTINE W REFLEX MICROSCOPIC - Abnormal; Notable for the following components:   Hgb urine dipstick SMALL (*)    Ketones, ur 5 (*)    All other components within normal limits  LIPASE, BLOOD  RPR  HIV ANTIBODY (ROUTINE TESTING W REFLEX)  I-STAT BETA HCG BLOOD, ED ([facility], [facility], [facility] ONLY)   GC/CHLAMYDIA PROBE AMP () NOT AT [facility]    EKG None  Radiology No results found.  Procedures .Foreign Body Removal  Date/Time: 06/10/2019 3:50 PM Performed by: Domenic Moras, PA-C Authorized by: Domenic Moras, PA-C  Consent: Verbal consent obtained. Risks and benefits: risks, benefits and alternatives were discussed Consent given by:  patient Patient understanding: patient states understanding of the procedure being performed Patient consent: the patient's understanding of the procedure matches consent given Patient identity confirmed: verbally with patient and arm band Time out: Immediately prior to procedure a "time out" was called to verify the correct patient, procedure, equipment, support staff and site/side marked as required. Body area: vagina  Sedation: Patient sedated: no  Patient restrained: no Patient cooperative: yes Localization method: speculum Removal mechanism: ring forceps Complexity: simple 1 objects recovered. Objects recovered: Mirena IUD Post-procedure assessment: foreign body removed Patient tolerance: patient tolerated the procedure well with no immediate complications   (including critical care time)  Medications Ordered in ED Medications  sodium chloride flush (NS) 0.9 % injection 3 mL (3 mLs Intravenous Not Given 06/10/19 1250)     Initial Impression / Assessment and Plan / ED Course  I have reviewed the triage vital signs and the nursing notes.  Pertinent labs & imaging results that were available during my care of the patient were reviewed by me and considered in my medical decision making (see chart for details).        BP (!) 151/88   Pulse 65   Temp 98.2 F (36.8 C) (Oral)   Resp 16   LMP 05/14/2019 (Approximate)   SpO2 99%    Final Clinical Impressions(s) / ED Diagnoses   Final diagnoses:  Encounter for IUD removal    ED Discharge Orders    None     Patient here with pelvic pain as well as abnormal uterine  bleeding.  She has an IUD.  She report initially experiencing intense abdominal pain which is since subsided.  She has a soft nontender abdomen on exam.  Will perform pelvic examination.    1:25 PM On pelvic semination, it appears patient has a Mirena IUD that is shifting out of place causing discomfort and bleeding. Will request OBGYN,   2:04 PM Appreciate consultation from oncall GYN Dr. Satira Sark who recommend using a ring forcep to extract the IUD.  Will perform procedure.   3:49 PM Successful removal of IUD using ring forcep.  Pt tolerates well.  Vaginal bleeding stopped.  Pt felt better.    Domenic Moras, PA-C 06/10/19 1604    Tegeler, Gwenyth Allegra, MD 06/10/19 1726

## 2019-06-10 NOTE — ED Notes (Signed)
Pt verbalizes understanding of d/c instructions. Pt ambulatory at d/c with all belongings.   

## 2019-06-10 NOTE — ED Triage Notes (Signed)
Patient reports RLQ pain with vaginal bleeding and mild nausea onset yesterday , history of fibroids , no diarrhea/ fever or chills .

## 2019-06-10 NOTE — Discharge Instructions (Signed)
Your Mirena IUD have been removed.  Please call and follow up closely with your doctor for further care.

## 2019-06-10 NOTE — ED Notes (Signed)
Saralyn Pilar, NT and this RN attempted to draw blood work on pt twice with no success. Pt flinches when being palpated, not cooperating with sitting still during blood draw.

## 2019-06-11 LAB — GC/CHLAMYDIA PROBE AMP (~~LOC~~) NOT AT ARMC
Chlamydia: NEGATIVE
Neisseria Gonorrhea: NEGATIVE

## 2019-06-11 LAB — RPR: RPR Ser Ql: NONREACTIVE

## 2019-08-19 ENCOUNTER — Other Ambulatory Visit: Payer: Self-pay | Admitting: Obstetrics and Gynecology

## 2019-08-19 DIAGNOSIS — Z9189 Other specified personal risk factors, not elsewhere classified: Secondary | ICD-10-CM

## 2020-01-04 ENCOUNTER — Other Ambulatory Visit: Payer: Self-pay

## 2020-01-04 ENCOUNTER — Emergency Department (HOSPITAL_COMMUNITY)
Admission: EM | Admit: 2020-01-04 | Discharge: 2020-01-04 | Disposition: A | Discharge status: Home / Self Care | Payer: BC Managed Care – PPO | Attending: Emergency Medicine | Admitting: Emergency Medicine

## 2020-01-04 ENCOUNTER — Encounter (HOSPITAL_COMMUNITY): Payer: Self-pay | Admitting: *Deleted

## 2020-01-04 DIAGNOSIS — I10 Essential (primary) hypertension: Secondary | ICD-10-CM | POA: Diagnosis not present

## 2020-01-04 DIAGNOSIS — R7309 Other abnormal glucose: Secondary | ICD-10-CM | POA: Insufficient documentation

## 2020-01-04 DIAGNOSIS — E039 Hypothyroidism, unspecified: Secondary | ICD-10-CM | POA: Diagnosis not present

## 2020-01-04 DIAGNOSIS — Z79899 Other long term (current) drug therapy: Secondary | ICD-10-CM | POA: Diagnosis not present

## 2020-01-04 DIAGNOSIS — Z7984 Long term (current) use of oral hypoglycemic drugs: Secondary | ICD-10-CM | POA: Insufficient documentation

## 2020-01-04 DIAGNOSIS — R1012 Left upper quadrant pain: Secondary | ICD-10-CM | POA: Insufficient documentation

## 2020-01-04 LAB — CBC WITH DIFFERENTIAL/PLATELET
Abs Immature Granulocytes: 0.04 10*3/uL (ref 0.00–0.07)
Basophils Absolute: 0.1 10*3/uL (ref 0.0–0.1)
Basophils Relative: 1 %
Eosinophils Absolute: 0.9 10*3/uL — ABNORMAL HIGH (ref 0.0–0.5)
Eosinophils Relative: 7 %
HCT: 42.6 % (ref 36.0–46.0)
Hemoglobin: 14.2 g/dL (ref 12.0–15.0)
Immature Granulocytes: 0 %
Lymphocytes Relative: 22 %
Lymphs Abs: 2.6 10*3/uL (ref 0.7–4.0)
MCH: 31.6 pg (ref 26.0–34.0)
MCHC: 33.3 g/dL (ref 30.0–36.0)
MCV: 94.9 fL (ref 80.0–100.0)
Monocytes Absolute: 0.8 10*3/uL (ref 0.1–1.0)
Monocytes Relative: 7 %
Neutro Abs: 7.2 10*3/uL (ref 1.7–7.7)
Neutrophils Relative %: 63 %
Platelets: 332 10*3/uL (ref 150–400)
RBC: 4.49 MIL/uL (ref 3.87–5.11)
RDW: 11.3 % — ABNORMAL LOW (ref 11.5–15.5)
WBC: 11.6 10*3/uL — ABNORMAL HIGH (ref 4.0–10.5)
nRBC: 0 % (ref 0.0–0.2)

## 2020-01-04 LAB — COMPREHENSIVE METABOLIC PANEL
ALT: 21 U/L (ref 0–44)
AST: 20 U/L (ref 15–41)
Albumin: 3.4 g/dL — ABNORMAL LOW (ref 3.5–5.0)
Alkaline Phosphatase: 51 U/L (ref 38–126)
Anion gap: 6 (ref 5–15)
BUN: 17 mg/dL (ref 6–20)
CO2: 27 mmol/L (ref 22–32)
Calcium: 8.8 mg/dL — ABNORMAL LOW (ref 8.9–10.3)
Chloride: 106 mmol/L (ref 98–111)
Creatinine, Ser: 0.74 mg/dL (ref 0.44–1.00)
GFR calc Af Amer: >60 mL/min (ref >60)
GFR calc non Af Amer: >60 mL/min (ref >60)
Glucose, Bld: 138 mg/dL — ABNORMAL HIGH (ref 70–99)
Potassium: 3.8 mmol/L (ref 3.5–5.1)
Sodium: 139 mmol/L (ref 135–145)
Total Bilirubin: 0.4 mg/dL (ref 0.3–1.2)
Total Protein: 6.7 g/dL (ref 6.5–8.1)

## 2020-01-04 LAB — URINALYSIS, ROUTINE W REFLEX MICROSCOPIC
Bacteria, UA: NONE SEEN
Bilirubin Urine: NEGATIVE
Glucose, UA: NEGATIVE mg/dL
Ketones, ur: NEGATIVE mg/dL
Leukocytes,Ua: NEGATIVE
Nitrite: NEGATIVE
Protein, ur: NEGATIVE mg/dL
Specific Gravity, Urine: 1.01 (ref 1.005–1.030)
pH: 6 (ref 5.0–8.0)

## 2020-01-04 LAB — LIPASE, BLOOD: Lipase: 32 U/L (ref 11–51)

## 2020-01-04 MED ORDER — ALUM & MAG HYDROXIDE-SIMETH 200-200-20 MG/5ML PO SUSP
30.0000 mL | Freq: Once | ORAL | Status: AC
  Administered 2020-01-04: 30 mL via ORAL
  Filled 2020-01-04: qty 30

## 2020-01-04 MED ORDER — LIDOCAINE VISCOUS HCL 2 % MT SOLN
15.0000 mL | Freq: Once | OROMUCOSAL | Status: AC
  Administered 2020-01-04: 15 mL via ORAL
  Filled 2020-01-04: qty 15

## 2020-01-04 MED ORDER — PANTOPRAZOLE SODIUM 40 MG PO TBEC
40.0000 mg | DELAYED_RELEASE_TABLET | Freq: Once | ORAL | Status: AC
  Administered 2020-01-04: 40 mg via ORAL
  Filled 2020-01-04: qty 1

## 2020-01-04 MED ORDER — PANTOPRAZOLE SODIUM 40 MG PO TBEC: 40.0000 mg | DELAYED_RELEASE_TABLET | Freq: Every day | ORAL | 0 refills | Status: AC

## 2020-01-04 NOTE — ED Triage Notes (Signed)
Pt c/o left side abd pain with nausea that became worse today,

## 2020-01-04 NOTE — ED Provider Notes (Signed)
[facility] EMERGENCY DEPARTMENT Provider Note   CSN: [id number] Arrival date & time: 01/04/20  0059   History Chief Complaint  Patient presents with  . Abdominal Pain    Christine [facility] is a 49 y.o. female.  The history is provided by the patient.  Abdominal Pain She has history of hypertension, GERD and comes in complaining of left upper abdominal pain which has been present for several years.  She states that tonight, she rolled over onto her side and the pain got much worse.  At that time, pain was rated at 8/10, but has subsided to 6/10.  She has seen her primary care provider regarding this and has been given medications for irritable bowel syndrome which have not helped.  She denies nausea, vomiting, constipation, diarrhea.  She denies fever, chills, sweats.  She denies any urinary difficulty.  She is requesting a CT scan.  Past Medical History:  Diagnosis Date  . Anxiety   . Fibroids   . GERD (gastroesophageal reflux disease)   . Headache(784.0)   . Hypertension   . Hypothyroidism     Patient Active Problem List   Diagnosis Date Noted  . Fibroids 09/09/2012    Past Surgical History:  Procedure Laterality Date  . CESAREAN SECTION    . LAPAROSCOPIC TUBAL LIGATION  12/27/2011   Procedure: LAPAROSCOPIC TUBAL LIGATION;  Surgeon: Darlyn Chamber, MD;  Location: [facility];  Service: Gynecology;  Laterality: Bilateral;  . spleenectomy       OB History    Gravida  1   Para  1   Term  1   Preterm  0   AB  0   Living  1     SAB  0   TAB  0   Ectopic  0   Multiple  0   Live Births              Family History  Problem Relation Age of Onset  . Heart disease Mother   . Cancer Maternal Aunt     Social History   Tobacco Use  . Smoking status: Never Smoker  . Smokeless tobacco: Never Used  Substance Use Topics  . Alcohol use: No  . Drug use: No    Home Medications Prior to Admission medications   Medication Sig Start Date End Date Taking?  Authorizing Provider  calcium carbonate (OS-CAL) 600 MG TABS Take 600 mg by mouth daily.    [provider]  cetirizine (ZYRTEC) 10 MG tablet Take 10 mg by mouth daily.    [provider]  cholecalciferol (VITAMIN D) 1000 UNITS tablet Take 2,000 Units by mouth daily.    [provider]  cimetidine (TAGAMET) 200 MG tablet Take 200 mg by mouth daily as needed. For acid reflux    [provider]  Cyanocobalamin (VITAMIN B-12 PO) Take by mouth every morning.    [provider]  glucosamine-chondroitin 500-400 MG tablet Take 1 tablet by mouth every morning.    [provider]  hydrochlorothiazide (MICROZIDE) 12.5 MG capsule Take 12.5 mg by mouth every morning.     [provider]  ibuprofen (ADVIL,MOTRIN) 200 MG tablet Take 400 mg by mouth every 6 (six) hours as needed. For pain    [provider]  levothyroxine (SYNTHROID, LEVOTHROID) 100 MCG tablet Take 100 mcg by mouth every morning.     [provider]  LUTEIN PO Take 1 capsule by mouth daily.    [provider]  naproxen sodium (ANAPROX) 220 MG tablet Take 220 mg by mouth 2 (two) times daily as needed. For migraines.    [provider]  vitamin A 8000 UNIT capsule Take 8,000 Units by mouth daily.      [provider]    Allergies    Patient has no known allergies.  Review of Systems   Review of Systems  Gastrointestinal: Positive for abdominal pain.  All other systems reviewed and are negative.   Physical Exam Updated Vital Signs BP (!) 155/101   Pulse 79   Temp 98 F (36.7 C) (Oral)   Resp 18   Ht 5\' 1"  (1.549 m)   Wt 89.8 kg   SpO2 98%   BMI 37.39 kg/m   Physical Exam Vitals and nursing note reviewed.   49 year old female, resting comfortably and in no acute distress. Vital signs are significant for elevated blood pressure. Oxygen saturation is 98%, which is normal. Head is normocephalic and atraumatic. PERRLA, EOMI.  Oropharynx is clear. Neck is nontender and supple without adenopathy or JVD. Back is nontender and there is no CVA tenderness. Lungs are clear without rales, wheezes, or rhonchi. Chest is nontender. Heart has regular rate and rhythm without murmur. Abdomen is soft, flat, nontender without masses or hepatosplenomegaly and peristalsis is normoactive. Extremities have no cyanosis or edema, full range of motion is present. Skin is warm and dry without rash. Neurologic: Mental status is normal, cranial nerves are intact, there are no motor or sensory deficits.  ED Results / Procedures / Treatments   Labs (all labs ordered are listed, but only abnormal results are displayed) Labs Reviewed  CBC WITH DIFFERENTIAL/PLATELET - Abnormal; Notable for the following components:      Result Value   WBC 11.6 (*)    RDW 11.3 (*)    Eosinophils Absolute 0.9 (*)    All other components within normal limits  COMPREHENSIVE METABOLIC PANEL - Abnormal; Notable for the following components:   Glucose, Bld 138 (*)    Calcium 8.8 (*)    Albumin 3.4 (*)    All other components within normal limits  LIPASE, BLOOD  URINALYSIS, ROUTINE W REFLEX MICROSCOPIC   Procedures Procedures  Medications Ordered in ED Medications  pantoprazole (PROTONIX) EC tablet 40 mg (has no administration in time range)  alum & mag hydroxide-simeth (MAALOX/MYLANTA) 200-200-20 MG/5ML suspension 30 mL (30 mLs Oral Given 01/04/20 0443)    And  lidocaine (XYLOCAINE) 2 % viscous mouth solution 15 mL (15 mLs Oral Given 01/04/20 0443)    ED Course  I have reviewed the triage vital signs and the nursing notes.  Pertinent lab results that were available during my care of the patient were reviewed by me and considered in my medical decision making (see chart for details).  MDM Rules/Calculators/A&P Chronic abdominal pain with benign exam.  I do not see indication for CT scan.  I suspect that this is GERD, possible irritable bowel syndrome.   Low index of suspicion for serious pathology such as diverticulitis, urolithiasis, abdominal aneurysm.  Old records are reviewed, she has no relevant past visits.  Labs are significant only for glucose 138, and very mildly elevated WBC without a left shift.  Will check urinalysis.  She is given a therapeutic trial of a GI cocktail.  Anticipate referral to gastroenterology.  Urinalysis is unremarkable.  She had good relief of pain with GI cocktail.  She is given a dose of pantoprazole and sent home with prescription for  same.  Referred back to her primary care provider for management of blood pressure and monitor elevated glucose, referred to gastroenterology for further work-up of abdominal pain.  Final Clinical Impression(s) / ED Diagnoses Final diagnoses:  LUQ abdominal pain  Elevated random blood glucose level  Elevated blood pressure reading with diagnosis of hypertension    Rx / DC Orders ED Discharge Orders         Ordered    pantoprazole (PROTONIX) 40 MG tablet  Daily     01/04/20 6893           Delora Fuel, MD 40/68/40 [phone]

## 2020-01-04 NOTE — Discharge Instructions (Signed)
Please follow up with your primary care provider to monitor your blood pressure and blood sugar.  Please make an appointment with the gastroenterologist to further evaluate your abdominal pain.

## 2020-05-11 NOTE — Patient Instructions (Addendum)
YOU ARE SCHEDULED FOR A COVID TEST 10/21_________@___3 :00p_________. THIS TEST MUST BE DONE BEFORE SURGERY. GO TO  [facility]. [place], [place], IT IS APPROXIMATELY 2 MINUTES PAST [employer] ON THE RIGHT AND REMAIN IN YOUR CAR, THIS IS A DRIVE UP TEST. ONCE YOUR COVID TEST IS DONE PLEASE FOLLOW ALL THE QUARANTINE  INSTRUCTIONS GIVEN IN YOUR HANDOUT.      Your procedure is scheduled on 05/23/20    Report to [facility] AT  5:30 A. M.   Call this number if you have problems the morning of surgery  :[phone].   OUR ADDRESS IS [address].  WE ARE LOCATED IN [facility].  PLEASE BRING YOUR INSURANCE CARD AND PHOTO ID DAY OF SURGERY.  ONLY ONE PERSON ALLOWED IN FACILITY WAITING AREA.                                     REMEMBER:  DO NOT EAT FOOD OR DRINK LIQUIDS AFTER MIDNIGHT .    YOU MAY  BRUSH YOUR TEETH MORNING OF SURGERY AND RINSE YOUR MOUTH OUT, NO CHEWING GUM CANDY OR MINTS.   TAKE THESE MEDICATIONS MORNING OF SURGERY WITH A SIP OF WATER:  Levothyroxine, Pantoprazole,use your eye drops as usual  ONE VISITOR IS ALLOWED IN WAITING ROOM ONLY DAY OF SURGERY.  NO VISITOR MAY SPEND THE NIGHT.  VISITOR ARE ALLOWED TO STAY UNTIL 8:00 PM.                                    DO NOT WEAR JEWERLY, MAKE UP, OR NAIL POLISH ON FINGERNAILS.  DO NOT WEAR LOTIONS, POWDERS, PERFUMES OR DEODORANT.  DO NOT SHAVE FOR 24 HOURS PRIOR TO DAY OF SURGERY.   CONTACTS, GLASSES, OR DENTURES MAY NOT BE WORN TO SURGERY.                                    [facility] IS NOT RESPONSIBLE  FOR ANY BELONGINGS.                                                                    Marland Kitchen                                [facility] - Preparing for Surgery Before surgery, you can play an important role.   Because skin is not sterile, your skin needs to be as free of germs as possible.  You can reduce the number of germs on your skin by washing with CHG  (chlorahexidine gluconate) soap before surgery .  CHG is an antiseptic cleaner which kills germs and bonds with the skin to continue killing germs even after washing. Please DO NOT use if you have an allergy to CHG or antibacterial soaps.   If your skin becomes reddened/irritated stop using the CHG and inform your nurse when you arrive at Short Stay. Do not shave (including  legs and underarms) for at least 48 hours prior to the first CHG shower  Please follow these instructions carefully:  1.  Shower with CHG Soap the night before surgery and the  morning of Surgery.  2.  If you choose to wash your hair, wash your hair first as usual with your  normal  shampoo.  3.  After you shampoo, rinse your hair and body thoroughly to remove the  shampoo.                                        4.  Use CHG as you would any other liquid soap.  You can apply chg directly  to the skin and wash                       Gently with a scrungie or clean washcloth.  5.  Apply the CHG Soap to your body ONLY FROM THE NECK DOWN.   Do not use on face/ open                           Wound or open sores. Avoid contact with eyes, ears mouth and genitals (private parts).                       Wash face,  Genitals (private parts) with your normal soap.             6.  Wash thoroughly, paying special attention to the area where your surgery  will be performed.  7.  Thoroughly rinse your body with warm water from the neck down.  8.  DO NOT shower/wash with your normal soap after using and rinsing off  the CHG Soap.             9.  Pat yourself dry with a clean towel.            10.  Wear clean pajamas.            11.  Place clean sheets on your bed the night of your first shower and do not  sleep with pets. Day of Surgery : Do not apply any lotions/deodorants the morning of surgery.  Please wear clean clothes to the hospital/surgery center.  FAILURE TO FOLLOW THESE INSTRUCTIONS MAY RESULT IN THE CANCELLATION OF YOUR  SURGERY PATIENT SIGNATURE_________________________________  NURSE SIGNATURE__________________________________  ________________________________________________________________________

## 2020-05-13 ENCOUNTER — Encounter (HOSPITAL_COMMUNITY): Payer: Self-pay

## 2020-05-13 ENCOUNTER — Encounter (HOSPITAL_COMMUNITY)
Admission: RE | Admit: 2020-05-13 | Discharge: 2020-05-13 | Disposition: A | Discharge status: Home / Self Care | Payer: BC Managed Care – PPO | Source: Ambulatory Visit | Attending: Obstetrics and Gynecology | Admitting: Obstetrics and Gynecology

## 2020-05-13 ENCOUNTER — Other Ambulatory Visit: Payer: Self-pay

## 2020-05-13 DIAGNOSIS — Z01812 Encounter for preprocedural laboratory examination: Secondary | ICD-10-CM | POA: Insufficient documentation

## 2020-05-13 HISTORY — DX: Myoneural disorder, unspecified: G70.9

## 2020-05-13 HISTORY — DX: Other specified postprocedural states: R11.2

## 2020-05-13 HISTORY — DX: Other specified postprocedural states: Z98.890

## 2020-05-13 HISTORY — DX: Prediabetes: R73.03

## 2020-05-13 HISTORY — DX: Sciatica, unspecified side: M54.30

## 2020-05-13 HISTORY — DX: Other complications of anesthesia, initial encounter: T88.59XA

## 2020-05-13 LAB — COMPREHENSIVE METABOLIC PANEL
ALT: 24 U/L (ref 0–44)
AST: 27 U/L (ref 15–41)
Albumin: 4 g/dL (ref 3.5–5.0)
Alkaline Phosphatase: 64 U/L (ref 38–126)
Anion gap: 6 (ref 5–15)
BUN: 20 mg/dL (ref 6–20)
CO2: 29 mmol/L (ref 22–32)
Calcium: 9.4 mg/dL (ref 8.9–10.3)
Chloride: 103 mmol/L (ref 98–111)
Creatinine, Ser: 0.69 mg/dL (ref 0.44–1.00)
GFR, Estimated: >60 mL/min (ref >60)
Glucose, Bld: 92 mg/dL (ref 70–99)
Potassium: 4.2 mmol/L (ref 3.5–5.1)
Sodium: 138 mmol/L (ref 135–145)
Total Bilirubin: 0.8 mg/dL (ref 0.3–1.2)
Total Protein: 7.6 g/dL (ref 6.5–8.1)

## 2020-05-13 LAB — CBC
HCT: 43.2 % (ref 36.0–46.0)
Hemoglobin: 14.9 g/dL (ref 12.0–15.0)
MCH: 31.4 pg (ref 26.0–34.0)
MCHC: 34.5 g/dL (ref 30.0–36.0)
MCV: 91.1 fL (ref 80.0–100.0)
Platelets: 348 10*3/uL (ref 150–400)
RBC: 4.74 MIL/uL (ref 3.87–5.11)
RDW: 11.9 % (ref 11.5–15.5)
WBC: 9.2 10*3/uL (ref 4.0–10.5)
nRBC: 0 % (ref 0.0–0.2)

## 2020-05-13 LAB — HCG, SERUM, QUALITATIVE: Preg, Serum: NEGATIVE

## 2020-05-13 NOTE — Progress Notes (Signed)
COVID Vaccine Completed:yes Date COVID Vaccine completed:02/17/20 COVID vaccine manufacturer:  Moderna   PCP - Dr. Lorin Glass Cardiologist - none  Chest x-ray - no EKG -  Stress Test - no ECHO - no Cardiac Cath - no Pacemaker/ICD device last checked:NA  Sleep Study - no CPAP -   Fasting Blood Sugar - monitors A1c Checks Blood Sugar _____ times a day  Blood Thinner Instructions:NA Aspirin Instructions: Last Dose:  Anesthesia review:   Patient denies shortness of breath, fever, cough and chest pain at PAT appointment yes   Patient verbalized understanding of instructions that were given to them at the PAT appointment. Patient was also instructed that they will need to review over the PAT instructions again at home before surgery. Yes No SOB with any activities

## 2020-05-14 LAB — HEMOGLOBIN A1C
Hgb A1c MFr Bld: 6 % — ABNORMAL HIGH (ref 4.8–5.6)
Mean Plasma Glucose: 126 mg/dL

## 2020-05-15 NOTE — H&P (Signed)
Patient name Christine Rush, Christine Rush DICTATION# [id number] CSN# [id number]  Arvella Nigh, MD 05/15/2020 10:50 AM

## 2020-05-16 NOTE — H&P (Signed)
NAME: Christine Rush, LUERAS MEDICAL RECORD [id number] ACCOUNT [id number] DATE OF BIRTH:[birth year] FACILITY: [facility] LOCATION:  PHYSICIAN:Jermone Geister [name], MD  HISTORY AND PHYSICAL  DATE OF ADMISSION:  05/23/2020  HISTORY OF PRESENT ILLNESS:  The patient is a 49 year old gravida 1, para 1, perimenopausal patient who presents for total abdominal hysterectomy with possible removal of tubes and ovaries.  We have been following the patient with uterine fibroids in the  past.  She has had attempts at endometrial ablation, which have been unsuccessful.  She had a previous radiological embolization of the fibroid, has been on Lupron, still having abnormal bleeding and pain, now presents for definitive therapy in the form  of total abdominal hysterectomy.  ALLERGIES:  SHE LISTS CORTICOSTEROIDS.  MEDICATIONS:  At the present time include levothyroxine 200 mcg a day, losartan 50 mg a day, [place] Lupron 3.75 mg monthly, pantoprazole 40 mg daily.  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 She is on Synthroid replacement.  PAST SURGICAL HISTORY:  Includes a splenectomy.  She has had a cesarean section.  She has had 2 attempts at hysteroscopy and endometrial ablation.  She has had a bilateral tubal ligation.  FAMILY HISTORY:  Noncontributory.  REVIEW OF SYSTEMS:  Noncontributory.  PHYSICAL EXAMINATION: VITAL SIGNS:  The patient is afebrile with stable vital signs. HEENT:  The patient is normocephalic.  Pupils equal, round, react to light and accommodation.  Extraocular movements were intact.  Sclerae and conjunctivae clear.  Oropharynx clear. NECK:  Without thyromegaly. BREASTS:  No discrete masses. LUNGS:  Clear. CARDIOVASCULAR:  Regular rhythm and rate without murmurs or gallops. ABDOMEN:  She has a midline incision from previous splenectomy.  She has a lower abdominal incision.  Uterus is palpable almost to the umbilicus, approximately 18 weeks in  size. PELVIC:  Normal external genitalia.  Vaginal mucosa clear.  Cervix unremarkable.  Uterus is enlarged consistent with uterine fibroids approximately 18 weeks in size.  Adnexa unremarkable. EXTREMITIES:  Trace edema. NEUROLOGIC:  Grossly within normal limits.  IMPRESSION: 1.  Uterine fibroids with associated menorrhagia. 2.  Previous radiological embolization. 3.  Previous attempts at endometrial ablation. 4.  Hypertension. 5.  Previous splenectomy. 6.  Hypothyroidism.  PLAN:  The patient to undergo total abdominal hysterectomy with possible removal of tubes and ovaries.  The nature of surgery has been discussed.  The risks have been explained including the risk of infection, the risk of hemorrhage that could require  transfusion with the risk of AIDS or hepatitis, risk of injury, risk of injury to adjacent organs such as bladder, bowel, ureters that could require further exploratory surgery, risk of venous thrombosis and pulmonary embolus.  The patient expressed  understanding of indications and risks.  VN/NUANCE  D:05/15/2020 T:05/15/2020 JOB:[id number]

## 2020-05-19 ENCOUNTER — Other Ambulatory Visit (HOSPITAL_COMMUNITY)
Admission: RE | Admit: 2020-05-19 | Discharge: 2020-05-19 | Disposition: A | Discharge status: Home / Self Care | Payer: BC Managed Care – PPO | Source: Ambulatory Visit | Attending: Obstetrics and Gynecology | Admitting: Obstetrics and Gynecology

## 2020-05-19 DIAGNOSIS — Z20822 Contact with and (suspected) exposure to covid-19: Secondary | ICD-10-CM | POA: Diagnosis not present

## 2020-05-19 DIAGNOSIS — Z01812 Encounter for preprocedural laboratory examination: Secondary | ICD-10-CM | POA: Insufficient documentation

## 2020-05-19 LAB — SARS CORONAVIRUS 2 (TAT 6-24 HRS): SARS Coronavirus 2: NEGATIVE

## 2020-05-21 NOTE — Anesthesia Preprocedure Evaluation (Addendum)
Anesthesia Evaluation  Patient identified by MRN, date of birth, ID band Patient awake    Reviewed: Allergy & Precautions, NPO status , Patient's Chart, lab work & pertinent test results  History of Anesthesia Complications (+) PONV and history of anesthetic complications  Airway Mallampati: II  TM Distance: >3 FB Neck ROM: Full    Dental  (+) Dental Advisory Given, Teeth Intact   Pulmonary neg pulmonary ROS,    Pulmonary exam normal        Cardiovascular hypertension, Pt. on medications Normal cardiovascular exam     Neuro/Psych  Headaches, PSYCHIATRIC DISORDERS Anxiety    GI/Hepatic Neg liver ROS, GERD  Medicated and Controlled,  Endo/Other  Hypothyroidism  Pre-DM Obesity   Renal/GU negative Renal ROS     Musculoskeletal negative musculoskeletal ROS (+)   Abdominal (+) + obese,   Peds  Hematology negative hematology ROS (+)   Anesthesia Other Findings Covid test negative   Reproductive/Obstetrics  Fibroids S/p tubal ligation                             Anesthesia Physical Anesthesia Plan  ASA: II  Anesthesia Plan: General   Post-op Pain Management:    Induction: Intravenous  PONV Risk Score and Plan: 4 or greater and Treatment may vary due to age or medical condition, Ondansetron, TIVA and Dexamethasone  Airway Management Planned: Oral ETT  Additional Equipment: None  Intra-op Plan:   Post-operative Plan: Extubation in OR  Informed Consent: I have reviewed the patients History and Physical, chart, labs and discussed the procedure including the risks, benefits and alternatives for the proposed anesthesia with the patient or authorized representative who has indicated his/her understanding and acceptance.     Dental advisory given  Plan Discussed with: CRNA and Anesthesiologist  Anesthesia Plan Comments: (TAP block refused by patient after discussion)        Anesthesia Quick Evaluation

## 2020-05-23 ENCOUNTER — Observation Stay (HOSPITAL_BASED_OUTPATIENT_CLINIC_OR_DEPARTMENT_OTHER): Payer: BC Managed Care – PPO | Admitting: Anesthesiology

## 2020-05-23 ENCOUNTER — Encounter (HOSPITAL_BASED_OUTPATIENT_CLINIC_OR_DEPARTMENT_OTHER): Payer: Self-pay | Admitting: Obstetrics and Gynecology

## 2020-05-23 ENCOUNTER — Encounter (HOSPITAL_BASED_OUTPATIENT_CLINIC_OR_DEPARTMENT_OTHER)
Admission: RE | Disposition: A | Discharge status: Home / Self Care | Payer: Self-pay | Source: Ambulatory Visit | Attending: Obstetrics and Gynecology

## 2020-05-23 ENCOUNTER — Observation Stay (HOSPITAL_BASED_OUTPATIENT_CLINIC_OR_DEPARTMENT_OTHER)
Admission: RE | Admit: 2020-05-23 | Discharge: 2020-05-24 | Disposition: A | Discharge status: Home / Self Care | Payer: BC Managed Care – PPO | Source: Ambulatory Visit | Attending: Obstetrics and Gynecology | Admitting: Obstetrics and Gynecology

## 2020-05-23 DIAGNOSIS — Z90710 Acquired absence of both cervix and uterus: Secondary | ICD-10-CM | POA: Diagnosis present

## 2020-05-23 DIAGNOSIS — D219 Benign neoplasm of connective and other soft tissue, unspecified: Secondary | ICD-10-CM | POA: Diagnosis present

## 2020-05-23 DIAGNOSIS — D259 Leiomyoma of uterus, unspecified: Principal | ICD-10-CM | POA: Insufficient documentation

## 2020-05-23 HISTORY — PX: HYSTERECTOMY ABDOMINAL WITH SALPINGECTOMY: SHX6725

## 2020-05-23 LAB — TYPE AND SCREEN
ABO/RH(D): A POS
Antibody Screen: NEGATIVE

## 2020-05-23 LAB — POCT PREGNANCY, URINE: Preg Test, Ur: NEGATIVE

## 2020-05-23 LAB — ABO/RH: ABO/RH(D): A POS

## 2020-05-23 SURGERY — HYSTERECTOMY, TOTAL, ABDOMINAL, WITH SALPINGECTOMY
Anesthesia: General | Site: Abdomen | Laterality: Bilateral

## 2020-05-23 MED ORDER — MENTHOL 3 MG MT LOZG: 1.0000 | LOZENGE | OROMUCOSAL | Status: DC | PRN

## 2020-05-23 MED ORDER — SUGAMMADEX SODIUM 200 MG/2ML IV SOLN
INTRAVENOUS | Status: DC | PRN
  Administered 2020-05-23: 200 mg via INTRAVENOUS

## 2020-05-23 MED ORDER — PANTOPRAZOLE SODIUM 40 MG PO TBEC
40.0000 mg | DELAYED_RELEASE_TABLET | Freq: Every day | ORAL | Status: DC
  Administered 2020-05-23: 40 mg via ORAL

## 2020-05-23 MED ORDER — ONDANSETRON HCL 4 MG/2ML IJ SOLN
INTRAMUSCULAR | Status: DC | PRN
  Administered 2020-05-23: 4 mg via INTRAVENOUS

## 2020-05-23 MED ORDER — CEFAZOLIN SODIUM-DEXTROSE 2-4 GM/100ML-% IV SOLN
INTRAVENOUS | Status: AC
  Filled 2020-05-23: qty 100

## 2020-05-23 MED ORDER — AMISULPRIDE (ANTIEMETIC) 5 MG/2ML IV SOLN
INTRAVENOUS | Status: AC
  Filled 2020-05-23: qty 4

## 2020-05-23 MED ORDER — PROMETHAZINE HCL 25 MG/ML IJ SOLN
INTRAMUSCULAR | Status: AC
  Filled 2020-05-23: qty 1

## 2020-05-23 MED ORDER — SODIUM CHLORIDE 0.9 % IR SOLN
Status: DC | PRN
  Administered 2020-05-23: 100 mL

## 2020-05-23 MED ORDER — BUPIVACAINE LIPOSOME 1.3 % IJ SUSP
INTRAMUSCULAR | Status: DC | PRN
  Administered 2020-05-23: 20 mL

## 2020-05-23 MED ORDER — IBUPROFEN 200 MG PO TABS
600.0000 mg | ORAL_TABLET | Freq: Four times a day (QID) | ORAL | Status: DC
  Administered 2020-05-23 – 2020-05-24 (×3): 600 mg via ORAL

## 2020-05-23 MED ORDER — PROMETHAZINE HCL 25 MG/ML IJ SOLN
6.2500 mg | INTRAMUSCULAR | Status: DC | PRN
  Administered 2020-05-23: 12.5 mg via INTRAVENOUS

## 2020-05-23 MED ORDER — LABETALOL HCL 5 MG/ML IV SOLN
INTRAVENOUS | Status: AC
  Filled 2020-05-23: qty 4

## 2020-05-23 MED ORDER — OXYCODONE HCL 5 MG PO TABS
ORAL_TABLET | ORAL | Status: AC
  Filled 2020-05-23: qty 1

## 2020-05-23 MED ORDER — FENTANYL CITRATE (PF) 100 MCG/2ML IJ SOLN
INTRAMUSCULAR | Status: AC
  Filled 2020-05-23: qty 2

## 2020-05-23 MED ORDER — PROPOFOL 10 MG/ML IV BOLUS
INTRAVENOUS | Status: AC
  Filled 2020-05-23: qty 20

## 2020-05-23 MED ORDER — FENTANYL CITRATE (PF) 100 MCG/2ML IJ SOLN
INTRAMUSCULAR | Status: DC | PRN
  Administered 2020-05-23 (×2): 100 ug via INTRAVENOUS

## 2020-05-23 MED ORDER — MIDAZOLAM HCL 5 MG/5ML IJ SOLN
INTRAMUSCULAR | Status: DC | PRN
  Administered 2020-05-23: 2 mg via INTRAVENOUS

## 2020-05-23 MED ORDER — FENTANYL CITRATE (PF) 100 MCG/2ML IJ SOLN
25.0000 ug | INTRAMUSCULAR | Status: DC | PRN
  Administered 2020-05-23 (×2): 50 ug via INTRAVENOUS

## 2020-05-23 MED ORDER — SCOPOLAMINE 1 MG/3DAYS TD PT72
1.0000 | MEDICATED_PATCH | TRANSDERMAL | Status: DC
  Administered 2020-05-23: 1.5 mg via TRANSDERMAL

## 2020-05-23 MED ORDER — ONDANSETRON HCL 4 MG PO TABS: 4.0000 mg | ORAL_TABLET | Freq: Four times a day (QID) | ORAL | Status: DC | PRN

## 2020-05-23 MED ORDER — DEXMEDETOMIDINE (PRECEDEX) IN NS 20 MCG/5ML (4 MCG/ML) IV SYRINGE
PREFILLED_SYRINGE | INTRAVENOUS | Status: AC
  Filled 2020-05-23: qty 5

## 2020-05-23 MED ORDER — POVIDONE-IODINE 10 % EX SWAB: 2.0000 "application " | Freq: Once | CUTANEOUS | Status: DC

## 2020-05-23 MED ORDER — LIDOCAINE 2% (20 MG/ML) 5 ML SYRINGE
INTRAMUSCULAR | Status: AC
  Filled 2020-05-23: qty 5

## 2020-05-23 MED ORDER — LEVOTHYROXINE SODIUM 200 MCG PO TABS
200.0000 ug | ORAL_TABLET | Freq: Every day | ORAL | Status: DC
  Administered 2020-05-24: 200 ug via ORAL
  Filled 2020-05-23 (×2): qty 1

## 2020-05-23 MED ORDER — DEXMEDETOMIDINE (PRECEDEX) IN NS 20 MCG/5ML (4 MCG/ML) IV SYRINGE
PREFILLED_SYRINGE | INTRAVENOUS | Status: DC | PRN
  Administered 2020-05-23: 8 ug via INTRAVENOUS

## 2020-05-23 MED ORDER — KETOROLAC TROMETHAMINE 30 MG/ML IJ SOLN
INTRAMUSCULAR | Status: AC
  Filled 2020-05-23: qty 1

## 2020-05-23 MED ORDER — OXYCODONE-ACETAMINOPHEN 5-325 MG PO TABS
ORAL_TABLET | ORAL | Status: AC
  Filled 2020-05-23: qty 2

## 2020-05-23 MED ORDER — ONDANSETRON HCL 4 MG/2ML IJ SOLN
INTRAMUSCULAR | Status: AC
  Filled 2020-05-23: qty 2

## 2020-05-23 MED ORDER — ACETAMINOPHEN 10 MG/ML IV SOLN
INTRAVENOUS | Status: AC
  Filled 2020-05-23: qty 100

## 2020-05-23 MED ORDER — OXYCODONE HCL 5 MG PO TABS
ORAL_TABLET | ORAL | Status: AC
Start: 2020-05-23 — End: ?
  Filled 2020-05-23: qty 1

## 2020-05-23 MED ORDER — CEFAZOLIN SODIUM-DEXTROSE 2-4 GM/100ML-% IV SOLN
2.0000 g | INTRAVENOUS | Status: AC
  Administered 2020-05-23: 2 g via INTRAVENOUS

## 2020-05-23 MED ORDER — DEXAMETHASONE SODIUM PHOSPHATE 10 MG/ML IJ SOLN
INTRAMUSCULAR | Status: DC | PRN
  Administered 2020-05-23: 10 mg via INTRAVENOUS

## 2020-05-23 MED ORDER — ONDANSETRON HCL 4 MG/2ML IJ SOLN
4.0000 mg | Freq: Four times a day (QID) | INTRAMUSCULAR | Status: DC | PRN
  Administered 2020-05-23 – 2020-05-24 (×3): 4 mg via INTRAVENOUS

## 2020-05-23 MED ORDER — OXYCODONE HCL 5 MG PO TABS: 5.0000 mg | ORAL_TABLET | Freq: Once | ORAL | Status: DC | PRN

## 2020-05-23 MED ORDER — SCOPOLAMINE 1 MG/3DAYS TD PT72
MEDICATED_PATCH | TRANSDERMAL | Status: AC
  Filled 2020-05-23: qty 1

## 2020-05-23 MED ORDER — ACETAMINOPHEN 325 MG PO TABS
650.0000 mg | ORAL_TABLET | Freq: Four times a day (QID) | ORAL | Status: DC | PRN
  Administered 2020-05-23 – 2020-05-24 (×3): 650 mg via ORAL

## 2020-05-23 MED ORDER — OXYCODONE HCL 5 MG/5ML PO SOLN: 5.0000 mg | Freq: Once | ORAL | Status: DC | PRN

## 2020-05-23 MED ORDER — LABETALOL HCL 5 MG/ML IV SOLN
INTRAVENOUS | Status: DC | PRN
  Administered 2020-05-23: 5 mg via INTRAVENOUS

## 2020-05-23 MED ORDER — OXYCODONE-ACETAMINOPHEN 5-325 MG PO TABS
1.0000 | ORAL_TABLET | ORAL | Status: DC | PRN
  Administered 2020-05-23: 2 via ORAL

## 2020-05-23 MED ORDER — ACETAMINOPHEN 325 MG PO TABS
ORAL_TABLET | ORAL | Status: AC
  Filled 2020-05-23: qty 2

## 2020-05-23 MED ORDER — LACTATED RINGERS IV SOLN: INTRAVENOUS | Status: DC

## 2020-05-23 MED ORDER — LIDOCAINE 2% (20 MG/ML) 5 ML SYRINGE
INTRAMUSCULAR | Status: DC | PRN
  Administered 2020-05-23: 60 mg via INTRAVENOUS

## 2020-05-23 MED ORDER — IBUPROFEN 200 MG PO TABS
ORAL_TABLET | ORAL | Status: AC
  Filled 2020-05-23: qty 3

## 2020-05-23 MED ORDER — ROCURONIUM BROMIDE 10 MG/ML (PF) SYRINGE
PREFILLED_SYRINGE | INTRAVENOUS | Status: DC | PRN
  Administered 2020-05-23: 10 mg via INTRAVENOUS
  Administered 2020-05-23: 60 mg via INTRAVENOUS

## 2020-05-23 MED ORDER — HYDROMORPHONE HCL 2 MG/ML IJ SOLN
INTRAMUSCULAR | Status: AC
  Filled 2020-05-23: qty 1

## 2020-05-23 MED ORDER — HYDRALAZINE HCL 20 MG/ML IJ SOLN
INTRAMUSCULAR | Status: AC
  Filled 2020-05-23: qty 1

## 2020-05-23 MED ORDER — AMISULPRIDE (ANTIEMETIC) 5 MG/2ML IV SOLN
10.0000 mg | Freq: Once | INTRAVENOUS | Status: AC
  Administered 2020-05-23: 10 mg via INTRAVENOUS

## 2020-05-23 MED ORDER — PROPOFOL 500 MG/50ML IV EMUL
INTRAVENOUS | Status: DC | PRN
  Administered 2020-05-23: 140 ug/kg/min via INTRAVENOUS

## 2020-05-23 MED ORDER — SODIUM CHLORIDE (PF) 0.9 % IJ SOLN
INTRAMUSCULAR | Status: DC | PRN
  Administered 2020-05-23: 20 mL via INTRAVENOUS

## 2020-05-23 MED ORDER — DEXAMETHASONE SODIUM PHOSPHATE 10 MG/ML IJ SOLN
INTRAMUSCULAR | Status: AC
  Filled 2020-05-23: qty 1

## 2020-05-23 MED ORDER — MIDAZOLAM HCL 2 MG/2ML IJ SOLN
INTRAMUSCULAR | Status: AC
  Filled 2020-05-23: qty 2

## 2020-05-23 MED ORDER — ACETAMINOPHEN 10 MG/ML IV SOLN
INTRAVENOUS | Status: DC | PRN
  Administered 2020-05-23: 1000 mg via INTRAVENOUS

## 2020-05-23 MED ORDER — HYDROMORPHONE HCL 1 MG/ML IJ SOLN
INTRAMUSCULAR | Status: DC | PRN
Start: 2020-05-23 — End: 2020-05-23
  Administered 2020-05-23: 1 mg via INTRAVENOUS

## 2020-05-23 MED ORDER — OXYCODONE HCL 5 MG PO TABS
5.0000 mg | ORAL_TABLET | ORAL | Status: DC | PRN
  Administered 2020-05-23 (×3): 5 mg via ORAL

## 2020-05-23 MED ORDER — PANTOPRAZOLE SODIUM 40 MG PO TBEC
DELAYED_RELEASE_TABLET | ORAL | Status: AC
  Filled 2020-05-23: qty 1

## 2020-05-23 MED ORDER — HYDRALAZINE HCL 20 MG/ML IJ SOLN
10.0000 mg | INTRAMUSCULAR | Status: DC | PRN
  Administered 2020-05-23: 10 mg via INTRAVENOUS

## 2020-05-23 MED ORDER — ROCURONIUM BROMIDE 10 MG/ML (PF) SYRINGE
PREFILLED_SYRINGE | INTRAVENOUS | Status: AC
  Filled 2020-05-23: qty 10

## 2020-05-23 MED ORDER — FENTANYL CITRATE (PF) 250 MCG/5ML IJ SOLN
INTRAMUSCULAR | Status: AC
  Filled 2020-05-23: qty 5

## 2020-05-23 MED ORDER — PROPOFOL 10 MG/ML IV BOLUS
INTRAVENOUS | Status: DC | PRN
  Administered 2020-05-23: 200 mg via INTRAVENOUS

## 2020-05-23 MED ORDER — KETOROLAC TROMETHAMINE 30 MG/ML IJ SOLN
INTRAMUSCULAR | Status: DC | PRN
  Administered 2020-05-23: 30 mg via INTRAVENOUS

## 2020-05-23 SURGICAL SUPPLY — 46 items
CANISTER SUCT 3000ML PPV (MISCELLANEOUS) ×1 IMPLANT
CLOSURE WOUND 1/2 X4 (GAUZE/BANDAGES/DRESSINGS) ×1
COVER WAND RF STERILE (DRAPES) ×3 IMPLANT
DRAPE WARM FLUID 44X44 (DRAPES) ×3 IMPLANT
DRSG OPSITE POSTOP 4X10 (GAUZE/BANDAGES/DRESSINGS) ×3 IMPLANT
DURAPREP 26ML APPLICATOR (WOUND CARE) ×3 IMPLANT
ELECT REM PT RETURN 9FT ADLT (ELECTROSURGICAL) ×3
ELECTRODE REM PT RTRN 9FT ADLT (ELECTROSURGICAL) IMPLANT
GAUZE 4X4 16PLY RFD (DISPOSABLE) ×2 IMPLANT
GLOVE BIO SURGEON STRL SZ7 (GLOVE) ×5 IMPLANT
GLOVE BIOGEL PI IND STRL 6.5 (GLOVE) IMPLANT
GLOVE BIOGEL PI IND STRL 7.0 (GLOVE) ×3 IMPLANT
GLOVE BIOGEL PI INDICATOR 6.5 (GLOVE) ×2
GLOVE BIOGEL PI INDICATOR 7.0 (GLOVE) ×4
GLOVE ECLIPSE 6.5 STRL STRAW (GLOVE) ×2 IMPLANT
GLOVE SURG SS PI 7.0 STRL IVOR (GLOVE) ×4 IMPLANT
GOWN STRL REUS W/TWL LRG LVL3 (GOWN DISPOSABLE) ×8 IMPLANT
HIBICLENS CHG 4% 4OZ (MISCELLANEOUS) ×3 IMPLANT
MANIFOLD NEPTUNE II (INSTRUMENTS) ×2 IMPLANT
NEEDLE HYPO 22GX1.5 SAFETY (NEEDLE) ×2 IMPLANT
NS IRRIG 1000ML POUR BTL (IV SOLUTION) ×5 IMPLANT
PACK ABDOMINAL GYN (CUSTOM PROCEDURE TRAY) ×3 IMPLANT
PAD OB MATERNITY 4.3X12.25 (PERSONAL CARE ITEMS) ×3 IMPLANT
PENCIL SMOKE EVACUATOR (MISCELLANEOUS) ×3 IMPLANT
PROTECTOR NERVE ULNAR (MISCELLANEOUS) ×1 IMPLANT
SPONGE LAP 18X18 RF (DISPOSABLE) ×6 IMPLANT
STAPLER VISISTAT 35W (STAPLE) IMPLANT
STRIP CLOSURE SKIN 1/2X4 (GAUZE/BANDAGES/DRESSINGS) ×1 IMPLANT
SUT CHROMIC 0 SH (SUTURE) IMPLANT
SUT CHROMIC 2 0 SH (SUTURE) IMPLANT
SUT CHROMIC 3 0 SH 27 (SUTURE) IMPLANT
SUT MNCRL AB 4-0 PS2 18 (SUTURE) IMPLANT
SUT MON AB 4-0 PS1 27 (SUTURE) IMPLANT
SUT PDS AB 0 CT 36 (SUTURE) ×5 IMPLANT
SUT PDS AB 0 CTX 60 (SUTURE) ×4 IMPLANT
SUT PLAIN 2 0 XLH (SUTURE) ×3 IMPLANT
SUT VIC AB 0 CT1 18XCR BRD8 (SUTURE) ×3 IMPLANT
SUT VIC AB 0 CT1 27 (SUTURE) ×3
SUT VIC AB 0 CT1 27XBRD ANBCTR (SUTURE) ×1 IMPLANT
SUT VIC AB 0 CT1 8-18 (SUTURE) ×9
SUT VIC AB 2-0 CT1 (SUTURE) ×3 IMPLANT
SUT VICRYL 0 TIES 12 18 (SUTURE) ×3 IMPLANT
SYR 30ML LL (SYRINGE) ×3 IMPLANT
TOWEL OR 17X26 10 PK STRL BLUE (TOWEL DISPOSABLE) ×4 IMPLANT
TRAY FOLEY W/BAG SLVR 14FR (SET/KITS/TRAYS/PACK) ×2 IMPLANT
TRAY FOLEY W/BAG SLVR 14FR LF (SET/KITS/TRAYS/PACK) ×1 IMPLANT

## 2020-05-23 NOTE — Op Note (Signed)
NAME: Christine Rush, VORIS MEDICAL RECORD [id number] ACCOUNT [id number] DATE OF BIRTH:03/06/1971 FACILITY: [facility] LOCATION: [facility] PHYSICIAN:Bea Duren [name], MD  OPERATIVE REPORT  DATE OF PROCEDURE:  05/23/2020  PREOPERATIVE DIAGNOSIS:  Uterine fibroids with associated abnormal bleeding.  POSTOPERATIVE DIAGNOSIS:  Uterine fibroids with associated abnormal bleeding.  OPERATIVE PROCEDURE:  Total abdominal hysterectomy with bilateral salpingectomy.  SURGEON:  Darlyn Chamber, MD  ASSISTANT:  Molli Posey, MD  ANESTHESIA:  General endotracheal.  ESTIMATED BLOOD LOSS:  150 mL.  PACKS:  None.  DRAINS:  Include urethral Foley.  INTRAOPERATIVE BLOOD PLACED:  None.  COMPLICATIONS:  None.  INDICATIONS:  Indications are dictated in the history and physical.  There is additional note, the patient desires to leave ovaries in place.  We discussed with her the risks and benefits of leaving the ovaries in place.  Risks can include the malignant  transformation that could be hard to detect.  She understands this, but wants ovaries left in place.  Fallopian tubes will be removed.  DESCRIPTION OF PROCEDURE:  The patient was taken to the OR and placed in supine position.  After satisfactory level of general endotracheal anesthesia was obtained, the patient's vagina was prepped out with Hibiclens.  Foley was placed to straight drain.   Abdomen was prepped out with DuraPrep.  After a period of time, the patient was draped in a sterile field.  A low transverse incision was made with a knife, carried through subcutaneous tissue.  Anterior rectus fascia entered sharply.  The incision in  the fascia was then extended laterally on both sides.  Peritoneum was entered sharply.  Incision in the peritoneum extended both superiorly and inferiorly.  The uterus was delivered through the incision.  It did have a large fundal fibroid.  Ovaries were  normal.  She had a previous bilateral tubal ligation.  Tubes  appeared normal.  There was no evidence of pelvic adhesions or abnormalities.  Appendix was visualized and noted to be normal.  Palpation of the upper abdomen was unremarkable.  First, both  round ligaments were clamped, cut, suture ligated with 0 Vicryl.  Uteroovarian pedicle on each side was isolated, clamped, cut, and doubly ligated with free ties of 0 Vicryl and then suture ligatures of 0 Vicryl.  The bladder flap was then developed.   Uterine arteries on each side were clamped, cut and suture ligated with 0 Vicryl.  Using the clamp, cut, and tie technique with suture ligatures of 0 Vicryl, the parametrium was serially separated from the sides of the uterus.  The bladder was dissected  and was free from the lower uterine and cervical segment.  Next, both vaginal angles were clamped and cut.  Intervening vaginal mucosa was excised.  Uterus and cervix was then passed off the operative field.  The angles were then suture ligated with 0  Vicryl.  Intervening vaginal mucosa was then closed with running locking suture of 0 Vicryl.  Both distal tube segments were identified, clamped and cut and sent to pathology.  Held pedicles secured with free ties of 0 Vicryl.  Both ovaries were normal.   We had good hemostasis at both ovarian cyst areas.  The place where we had removed the tubes was also hemostatically intact.  Bilateral sides were hemostatically intact.  Vaginal cuff was hemostatically intact.  We irrigated the pelvis and again had  good hemostasis.  Urine output was clear and adequate.  We had put in 2 sponges and these were removed.  Peritoneum was then  closed with running locking suture of 0 chromic.  Fascia was closed with 0 PDS.  Subcutaneous was closed with interrupted sutures  of 3-0 chromic.  Skin was closed with running subcuticular 4-0 Monocryl.  Steri-Strips were then applied.  Sponge, instrument and needle count was correct by circulating nurse x2.  Foley catheter remained clear at time of  closure.  The patient tolerated  the procedure well and was returned to recovery room in good condition.  VN/NUANCE  D:05/23/2020 T:05/23/2020 JOB:[id number]

## 2020-05-23 NOTE — Progress Notes (Signed)
Patient had an episode of projectile vomiting around 21:20 this evening. Patient had about 300 ml total of emesis. Linens changed and cool wash cloth provided. Will continue to closely monitor patient and give prn nausea medicine when time.

## 2020-05-23 NOTE — H&P (Signed)
  History and physical exam unchanged 

## 2020-05-23 NOTE — Brief Op Note (Signed)
05/23/2020  8:43 AM  PATIENT:  Christine Rush  49 y.o. female  PRE-OPERATIVE DIAGNOSIS:  fibroids  POST-OPERATIVE DIAGNOSIS:  fibroids  PROCEDURE:  Procedure(s) with comments: HYSTERECTOMY ABDOMINAL WITH BILATERAL SALPINGECTOMY (Bilateral) - need bed  SURGEON:  Surgeon(s) and Role:    * Arvella Nigh, MD - Primary    * Molli Posey, MD - Assisting  PHYSICIAN ASSISTANT:   ASSISTANTS: holland   ANESTHESIA:   general  EBL:  150 mL   BLOOD ADMINISTERED:none  DRAINS: Urinary Catheter (Foley)   LOCAL MEDICATIONS USED:  NONE  SPECIMEN:  Source of Specimen:  uterus and both tubes  DISPOSITION OF SPECIMEN:  PATHOLOGY  COUNTS:  YES  TOURNIQUET:  * No tourniquets in log *  DICTATION: .Other Dictation: Dictation Number [id number]  PLAN OF CARE: Admit for overnight observation  PATIENT DISPOSITION:  PACU - hemodynamically stable.   Delay start of Pharmacological VTE agent (>24hrs) due to surgical blood loss or risk of bleeding: no

## 2020-05-23 NOTE — Progress Notes (Signed)
Patient ID: Christine Rush, female   DOB: Apr 20, 1971, 49 y.o.   MRN: [id number] Af vss No bleeding Good uo abd soft

## 2020-05-23 NOTE — Anesthesia Procedure Notes (Signed)
Procedure Name: Intubation Date/Time: 05/23/2020 7:24 AM Performed by: Rogers Blocker, CRNA Pre-anesthesia Checklist: Patient identified, Emergency Drugs available, Suction available and Patient being monitored Patient Re-evaluated:Patient Re-evaluated prior to induction Oxygen Delivery Method: Circle System Utilized Preoxygenation: Pre-oxygenation with 100% oxygen Induction Type: IV induction Ventilation: Mask ventilation without difficulty Laryngoscope Size: Mac and 3 Grade View: Grade I Tube type: Oral Number of attempts: 1 Airway Equipment and Method: Stylet Placement Confirmation: ETT inserted through vocal cords under direct vision,  positive ETCO2 and breath sounds checked- equal and bilateral Secured at: 22 cm Tube secured with: Tape Dental Injury: Teeth and Oropharynx as per pre-operative assessment

## 2020-05-23 NOTE — Transfer of Care (Signed)
Immediate Anesthesia Transfer of Care Note  Patient: Christine Rush  Procedure(s) Performed: HYSTERECTOMY ABDOMINAL WITH BILATERAL SALPINGECTOMY (Bilateral Abdomen)  Patient Location: PACU  Anesthesia Type:General  Level of Consciousness: awake, alert , oriented and patient cooperative  Airway & Oxygen Therapy: Patient Spontanous Breathing and Patient connected to nasal cannula oxygen  Post-op Assessment: Report given to RN and Post -op Vital signs reviewed and stable  Post vital signs: Reviewed and stable  Last Vitals:  Vitals Value Taken Time  BP 174/104 05/23/20 0848  Temp 36.7 C 05/23/20 0850  Pulse 64 05/23/20 0851  Resp 14 05/23/20 0851  SpO2 100 % 05/23/20 0851  Vitals shown include unvalidated device data.  Last Pain:  Vitals:   05/23/20 0850  TempSrc: Oral  PainSc:       Patients Stated Pain Goal: 4 (01/22/93 8546)  Complications: No complications documented.

## 2020-05-24 ENCOUNTER — Encounter (HOSPITAL_BASED_OUTPATIENT_CLINIC_OR_DEPARTMENT_OTHER): Payer: Self-pay | Admitting: Obstetrics and Gynecology

## 2020-05-24 DIAGNOSIS — D259 Leiomyoma of uterus, unspecified: Secondary | ICD-10-CM | POA: Diagnosis not present

## 2020-05-24 LAB — CBC
HCT: 38.7 % (ref 36.0–46.0)
Hemoglobin: 13.3 g/dL (ref 12.0–15.0)
MCH: 31.7 pg (ref 26.0–34.0)
MCHC: 34.4 g/dL (ref 30.0–36.0)
MCV: 92.4 fL (ref 80.0–100.0)
Platelets: 307 10*3/uL (ref 150–400)
RBC: 4.19 MIL/uL (ref 3.87–5.11)
RDW: 12 % (ref 11.5–15.5)
WBC: 17.8 10*3/uL — ABNORMAL HIGH (ref 4.0–10.5)
nRBC: 0 % (ref 0.0–0.2)

## 2020-05-24 LAB — SURGICAL PATHOLOGY

## 2020-05-24 MED ORDER — OXYCODONE-ACETAMINOPHEN 7.5-325 MG PO TABS: 1.0000 | ORAL_TABLET | Freq: Four times a day (QID) | ORAL | Status: DC | PRN

## 2020-05-24 MED ORDER — ACETAMINOPHEN 325 MG PO TABS
ORAL_TABLET | ORAL | Status: AC
  Filled 2020-05-24: qty 2

## 2020-05-24 MED ORDER — OXYCODONE-ACETAMINOPHEN 7.5-325 MG PO TABS: 1.0000 | ORAL_TABLET | Freq: Four times a day (QID) | ORAL | 0 refills | Status: AC | PRN

## 2020-05-24 MED ORDER — IBUPROFEN 200 MG PO TABS
ORAL_TABLET | ORAL | Status: AC
  Filled 2020-05-24: qty 3

## 2020-05-24 MED ORDER — OXYCODONE-ACETAMINOPHEN 5-325 MG PO TABS: 1.0000 | ORAL_TABLET | Freq: Four times a day (QID) | ORAL | Status: DC | PRN

## 2020-05-24 MED ORDER — OXYCODONE HCL 5 MG PO TABS: 2.5000 mg | ORAL_TABLET | Freq: Four times a day (QID) | ORAL | Status: DC | PRN

## 2020-05-24 NOTE — Discharge Summary (Signed)
Patient name  Christine Rush, Christine Rush DICTATION# [id number] CSN# [id number]  Arvella Nigh, MD 05/24/2020 7:53 AM

## 2020-05-24 NOTE — Anesthesia Postprocedure Evaluation (Signed)
Anesthesia Post Note  Patient: Christine Rush  Procedure(s) Performed: HYSTERECTOMY ABDOMINAL WITH BILATERAL SALPINGECTOMY (Bilateral Abdomen)     Patient location during evaluation: PACU Anesthesia Type: General Level of consciousness: awake and alert Pain management: pain level controlled Vital Signs Assessment: post-procedure vital signs reviewed and stable Respiratory status: spontaneous breathing, nonlabored ventilation and respiratory function stable Cardiovascular status: blood pressure returned to baseline and stable Postop Assessment: no apparent nausea or vomiting Anesthetic complications: no   No complications documented.  Last Vitals:  Vitals:   05/24/20 0214 05/24/20 0511  BP: 121/80 115/78  Pulse: 90 72  Resp: 18 16  Temp: 37.1 C 37.2 C  SpO2: 92% 96%    Last Pain:  Vitals:   05/24/20 0511  TempSrc:   PainSc: [name]

## 2020-05-24 NOTE — Discharge Summary (Signed)
NAME: Christine Rush, Christine Rush MEDICAL RECORD [id number] ACCOUNT [id number] DATE OF BIRTH:06-Jan-1971 FACILITY: [facility] LOCATION: [facility] PHYSICIAN:Chasmine Lender [name], MD  DISCHARGE SUMMARY  DATE OF DISCHARGE:  05/24/2020  ADMITTING DIAGNOSIS:  Symptomatic uterine fibroids.  DISCHARGE DIAGNOSIS:  Symptomatic uterine fibroids.  PROCEDURE:  Total abdominal hysterectomy with bilateral salpingectomy.  For complete history and physical, please see dictated note.  HOSPITAL COURSE:  The patient underwent above noted surgery.  Pathology is still pending.  Postoperatively, she did well.  Her postop hemoglobin was 13.3.  At the time of discharge, she was afebrile with stable vital signs.  She was voiding without  difficulty.  She was tolerating her diet.  Incision was clear.  Abdomen was soft, nontender.  She had no active vaginal bleeding.  In terms of complications, none were encountered during her stay in the hospital.  The patient discharged home in stable condition.  DISPOSITION:  The patient to avoid heavy lifting, vaginal entrance or driving a car.  She should call with signs of fever.  Heavy vaginal bleeding should be reported.  Nausea, vomiting should be reported.  Excessive pain should be reported.  Also,  instructed in signs and symptoms of deep venous thrombosis and pulmonary embolus.  Discharged home on Percocet she needs for pain.  Office will call her tomorrow and arrange followup in the office in approximately 1 week.  HN/NUANCE D:05/24/2020 T:05/24/2020 JOB:[id number]

## 2020-05-24 NOTE — Discharge Instructions (Signed)
Abdominal Hysterectomy, Care After This sheet gives you information about how to care for yourself after your procedure. Your doctor may also give you more specific instructions. If you have problems or questions, contact your doctor. Follow these instructions at home: Bathing  Do not take baths, swim, or use a hot tub until your doctor says it is okay. Ask your doctor if you can take showers. You may only be allowed to take sponge baths for bathing.  Keep the bandage (dressing) dry until your doctor says it can be taken off. Surgical cut (incision) care      Follow instructions from your doctor about how to take care of your cut from surgery. Make sure you: ? Wash your hands with soap and water before you change your bandage (dressing). If you cannot use soap and water, use hand sanitizer. ? Change your bandage as told by your doctor. ? Leave stitches (sutures), skin glue, or skin tape (adhesive) strips in place. They may need to stay in place for 2 weeks or longer. If tape strips get loose and curl up, you may trim the loose edges. Do not remove tape strips completely unless your doctor says it is okay.  Check your surgical cut area every day for signs of infection. Check for: ? Redness, swelling, or pain. ? Fluid or blood. ? Warmth. ? Pus or a bad smell. Activity  Do gentle, daily exercise as told by your doctor. You may be told to take short walks every day and go farther each time.  Do not lift anything that is heavier than 10 lb (4.5 kg), or the limit that your doctor tells you, until he or she says that it is safe.  Do not drive or use heavy machinery while taking prescription pain medicine.  Do not drive for 24 hours if you were given a medicine to help you relax (sedative).  Follow your doctor's advice about exercise, driving, and general activities. Ask your doctor what activities are safe for you. Lifestyle   Do not douche, use tampons, or have sex for at least 6 weeks  or as told by your doctor.  Do not drink alcohol until your doctor says it is okay.  Drink enough fluid to keep your pee (urine) clear or pale yellow.  Try to have someone at home with you for the first 1-2 weeks to help.  Do not use any products that contain nicotine or tobacco, such as cigarettes and e-cigarettes. These can slow down healing. If you need help quitting, ask your doctor. General instructions  Take over-the-counter and prescription medicines only as told by your doctor.  Do not take aspirin or ibuprofen. These medicines can cause bleeding.  To prevent or treat constipation while you are taking prescription pain medicine, your doctor may suggest that you: ? Drink enough fluid to keep your urine clear or pale yellow. ? Take over-the-counter or prescription medicines. ? Eat foods that are high in fiber, such as:  Fresh fruits and vegetables.  Whole grains.  Beans. ? Limit foods that are high in fat and processed sugars, such as fried and sweet foods.  Keep all follow-up visits as told by your doctor. This is important. Contact a doctor if:  You have chills or fever.  You have redness, swelling, or pain around your cut.  You have fluid or blood coming from your cut.  Your cut feels warm to the touch.  You have pus or a bad smell coming from your cut.    Your cut breaks open.  You feel dizzy or light-headed.  You have pain or bleeding when you pee.  You keep having watery poop (diarrhea).  You keep feeling sick to your stomach (nauseous) or keep throwing up (vomiting).  You have unusual fluid (discharge) coming from your vagina.  You have a rash.  You have a reaction to your medicine.  Your pain medicine does not help. Get help right away if:  You have a fever and your symptoms get worse all of a sudden.  You have very bad belly (abdominal) pain.  You are short of breath.  You pass out (faint).  You have pain, swelling, or redness of your  leg.  You bleed a lot from your vagina and notice clumps of blood (clots). Summary  Do not take baths, swim, or use a hot tub until your doctor says it is okay. Ask your doctor if you can take showers. You may only be allowed to take sponge baths for bathing.  Follow your doctor's advice about exercise, driving, and general activities. Ask your doctor what activities are safe for you.  Do not lift anything that is heavier than 10 lb (4.5 kg), or the limit that your doctor tells you, until he or she says that it is safe.  Try to have someone at home with you for the first 1-2 weeks to help. This information is not intended to replace advice given to you by your health care provider. Make sure you discuss any questions you have with your health care provider. Document Revised: 09/18/2018 Document Reviewed: 07/04/2016 Elsevier Patient Education  2020 [employer]  

## 2020-05-24 NOTE — Progress Notes (Signed)
1 Day Post-Op Procedure(s) (LRB): HYSTERECTOMY ABDOMINAL WITH BILATERAL SALPINGECTOMY (Bilateral)  Subjective: Patient reports tolerating PO and no problems voiding.    Objective: I have reviewed patient's vital signs, intake and output and labs.  General: alert GI: soft, non-tender; bowel sounds normal; no masses,  no organomegaly Vaginal Bleeding: minimal  Assessment: s/p Procedure(s) with comments: HYSTERECTOMY ABDOMINAL WITH BILATERAL SALPINGECTOMY (Bilateral) - need bed: stable  Plan: Discharge home  LOS: 0 days    Christine Rush 05/24/2020, 7:49 AM

## 2020-08-20 ENCOUNTER — Encounter: Payer: Self-pay | Admitting: Emergency Medicine

## 2020-08-20 ENCOUNTER — Other Ambulatory Visit: Payer: Self-pay

## 2020-08-20 ENCOUNTER — Ambulatory Visit
Admission: EM | Admit: 2020-08-20 | Discharge: 2020-08-20 | Disposition: A | Discharge status: Home / Self Care | Payer: BC Managed Care – PPO | Attending: Family Medicine | Admitting: Family Medicine

## 2020-08-20 ENCOUNTER — Ambulatory Visit (INDEPENDENT_AMBULATORY_CARE_PROVIDER_SITE_OTHER): Payer: BC Managed Care – PPO

## 2020-08-20 DIAGNOSIS — R0789 Other chest pain: Secondary | ICD-10-CM

## 2020-08-20 DIAGNOSIS — J069 Acute upper respiratory infection, unspecified: Secondary | ICD-10-CM

## 2020-08-20 MED ORDER — AZITHROMYCIN 250 MG PO TABS: 250.0000 mg | ORAL_TABLET | Freq: Every day | ORAL | 0 refills | Status: AC

## 2020-08-20 NOTE — ED Triage Notes (Signed)
States she had a mammogram done back in the summer and has been having left side pain since then.

## 2020-08-20 NOTE — ED Provider Notes (Signed)
[place]   [id number] 08/20/20 Arrival Time: 1324  MW:NUUVO PAIN  SUBJECTIVE: History from: patient. Christine Rush is a 50 y.o. female complains of L chest wall pain that began about 2 months ago after she had a mammogram.  Denies a precipitating event or specific injury.  Localizes the pain to the left chest wall.  Also reports that she has been having a dry cough as well as nasal congestion for the last week and a half.  Describes the pain as intermittent and achy in character.  Has tried OTC medications without relief.  Symptoms are made worse with activity.  Denies similar symptoms in the past.  Denies fever, chills, erythema, ecchymosis, effusion, weakness, numbness and tingling, saddle paresthesias, loss of bowel or bladder function.      ROS: As per HPI.  All other pertinent ROS negative.     Past Medical History:  Diagnosis Date  . Anxiety   . Complication of anesthesia   . Fibroids   . GERD (gastroesophageal reflux disease)   . 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 Hypertension   . Hypothyroidism   . Neuromuscular disorder (HCC)    hands tingle  . PONV (postoperative nausea and vomiting)   . Pre-diabetes   . Sciatica    Past Surgical History:  Procedure Laterality Date  . CESAREAN SECTION    . HYSTERECTOMY ABDOMINAL WITH SALPINGECTOMY Bilateral 05/23/2020   Procedure: HYSTERECTOMY ABDOMINAL WITH BILATERAL SALPINGECTOMY;  Surgeon: Arvella Nigh, MD;  Location: [place];  Service: Gynecology;  Laterality: Bilateral;  need bed  . LAPAROSCOPIC TUBAL LIGATION  12/27/2011   Procedure: LAPAROSCOPIC TUBAL LIGATION;  Surgeon: Darlyn Chamber, MD;  Location: [facility];  Service: Gynecology;  Laterality: Bilateral;  . spleenectomy     No Known Allergies No current facility-administered medications on file prior to encounter.   Current Outpatient Medications on File Prior to Encounter  Medication Sig Dispense Refill  . azelastine (OPTIVAR) 0.05 %  ophthalmic solution Place 1 drop into both eyes in the morning and at bedtime.    . calcium carbonate (OS-CAL) 600 MG TABS Take 600 mg by mouth daily. (Patient not taking: Reported on 05/05/2020)    . cetirizine (ZYRTEC) 10 MG tablet Take 10 mg by mouth daily. (Patient not taking: Reported on 05/05/2020)    . cholecalciferol (VITAMIN D) 1000 UNITS tablet Take 2,000 Units by mouth daily.     . cimetidine (TAGAMET) 200 MG tablet Take 200 mg by mouth daily as needed. For acid reflux    . Cyanocobalamin (VITAMIN B-12 PO) Take by mouth every morning.     Marland Kitchen glucosamine-chondroitin 500-400 MG tablet Take 1 tablet by mouth every morning.     . hydrochlorothiazide (MICROZIDE) 12.5 MG capsule Take 12.5 mg by mouth every morning.     Marland Kitchen ibuprofen (ADVIL,MOTRIN) 200 MG tablet Take 400 mg by mouth every 6 (six) hours as needed. For pain    . levothyroxine (SYNTHROID) 200 MCG tablet Take 200 mcg by mouth daily before breakfast.     . losartan (COZAAR) 50 MG tablet Take 50 mg by mouth daily.    . LUTEIN PO Take 1 capsule by mouth daily.     . Multiple Vitamin (MULTIVITAMIN WITH MINERALS) TABS tablet Take 1 tablet by mouth daily.    . naproxen sodium (ANAPROX) 220 MG tablet Take 220 mg by mouth 2 (two) times daily as needed. For migraines. (Patient not taking: Reported on 05/05/2020)    .  oxyCODONE-acetaminophen (PERCOCET) 7.5-325 MG tablet Take 1 tablet by mouth every 6 (six) hours as needed for severe pain. 20 tablet 0  . pantoprazole (PROTONIX) 40 MG tablet Take 1 tablet (40 mg total) by mouth daily. (Patient taking differently: Take 40 mg by mouth daily as needed (acid reflux). ) 30 tablet 0  . sodium chloride (OCEAN) 0.65 % SOLN nasal spray Place 1 spray into both nostrils as needed for congestion.    . vitamin A 8000 UNIT capsule Take 8,000 Units by mouth daily.   (Patient not taking: Reported on 05/05/2020)     Social History   Socioeconomic History  . Marital status: Single    Spouse name: Not on file  .  Number of children: Not on file  . Years of education: Not on file  . Highest education level: Not on file  Occupational History  . Not on file  Tobacco Use  . Smoking status: Never Smoker  . Smokeless tobacco: Never Used  Vaping Use  . Vaping Use: Never used  Substance and Sexual Activity  . Alcohol use: No  . Drug use: No  . Sexual activity: Never    Birth control/protection: I.U.D.  Other Topics Concern  . Not on file  Social History Narrative  . Not on file   Social Determinants of Health   Financial Resource Strain: Not on file  Food Insecurity: Not on file  Transportation Needs: Not on file  Physical Activity: Not on file  Stress: Not on file  Social Connections: Not on file  Intimate Partner Violence: Not on file   Family History  Problem Relation Age of Onset  . Heart disease Mother   . Cancer Maternal Aunt     OBJECTIVE:  Vitals:   08/20/20 1310 08/20/20 1311  BP: (!) 145/88   Pulse: 72   Resp: 18   Temp: 97.8 F (36.6 C)   TempSrc: Oral   SpO2: 96%   Weight:  175 lb (79.4 kg)  Height:  5\' 2"  (1.575 m)    General appearance: ALERT; in no acute distress.  Head: NCAT Lungs: Normal respiratory effort CV: pulses 2+ bilaterally. Cap refill < 2 seconds Musculoskeletal:  Inspection: Skin warm, dry, clear and intact No erythema, effusion to left chest wall Palpation: Left chest wall tender to palpation ROM: FROM active and passive  Skin: warm and dry, no ecchymosis or erythema appreciated Neurologic: Ambulates without difficulty; Sensation intact about the upper/ lower extremities Psychological: alert and cooperative; normal mood and affect  DIAGNOSTIC STUDIES:  No results found.   ASSESSMENT & PLAN:  1. Left-sided chest wall pain   2. Upper respiratory tract infection, unspecified type     Meds ordered this encounter  Medications  . azithromycin (ZITHROMAX) 250 MG tablet    Sig: Take 1 tablet (250 mg total) by mouth daily. Take first 2  tablets together, then 1 every day until finished.    Dispense:  6 tablet    Refill:  0    Order Specific Question:   Supervising Provider    Answer:   Chase Picket [[id number]]    X-rays negative for any abnormalities in the chest, no fractures, no pneumonia Azithromycin prescribed for URI Declines Covid testing today Continue conservative management of rest, ice, and gentle stretches Take ibuprofen as needed for pain relief (may cause abdominal discomfort, ulcers, and GI bleeds avoid taking with other NSAIDs) Follow up with PCP if symptoms persist Return or go to the ER if you  have any new or worsening symptoms (fever, chills, chest pain, abdominal pain, changes in bowel or bladder habits, pain radiating into lower legs)    Reviewed expectations re: course of current medical issues. Questions answered. Outlined signs and symptoms indicating need for more acute intervention. Patient verbalized understanding. After Visit Summary given.       Faustino Congress, NP 08/22/20 1035

## 2020-08-20 NOTE — Discharge Instructions (Addendum)
Chest xray today was negative for any chest abnormality  May use ibuprofen and tylenol as needed for pain  Follow up with this office or with primary care if symptoms are persisting.  Follow up in the ER for high fever, trouble swallowing, trouble breathing, other concerning symptoms.

## 2021-04-12 IMAGING — DX DG CHEST 2V
2 series · 2 of 2 positions shown · non-contrast
Comparison: None.

CLINICAL DATA: Chest wall pain.

EXAM:
CHEST - 2 VIEW

[chest pa]
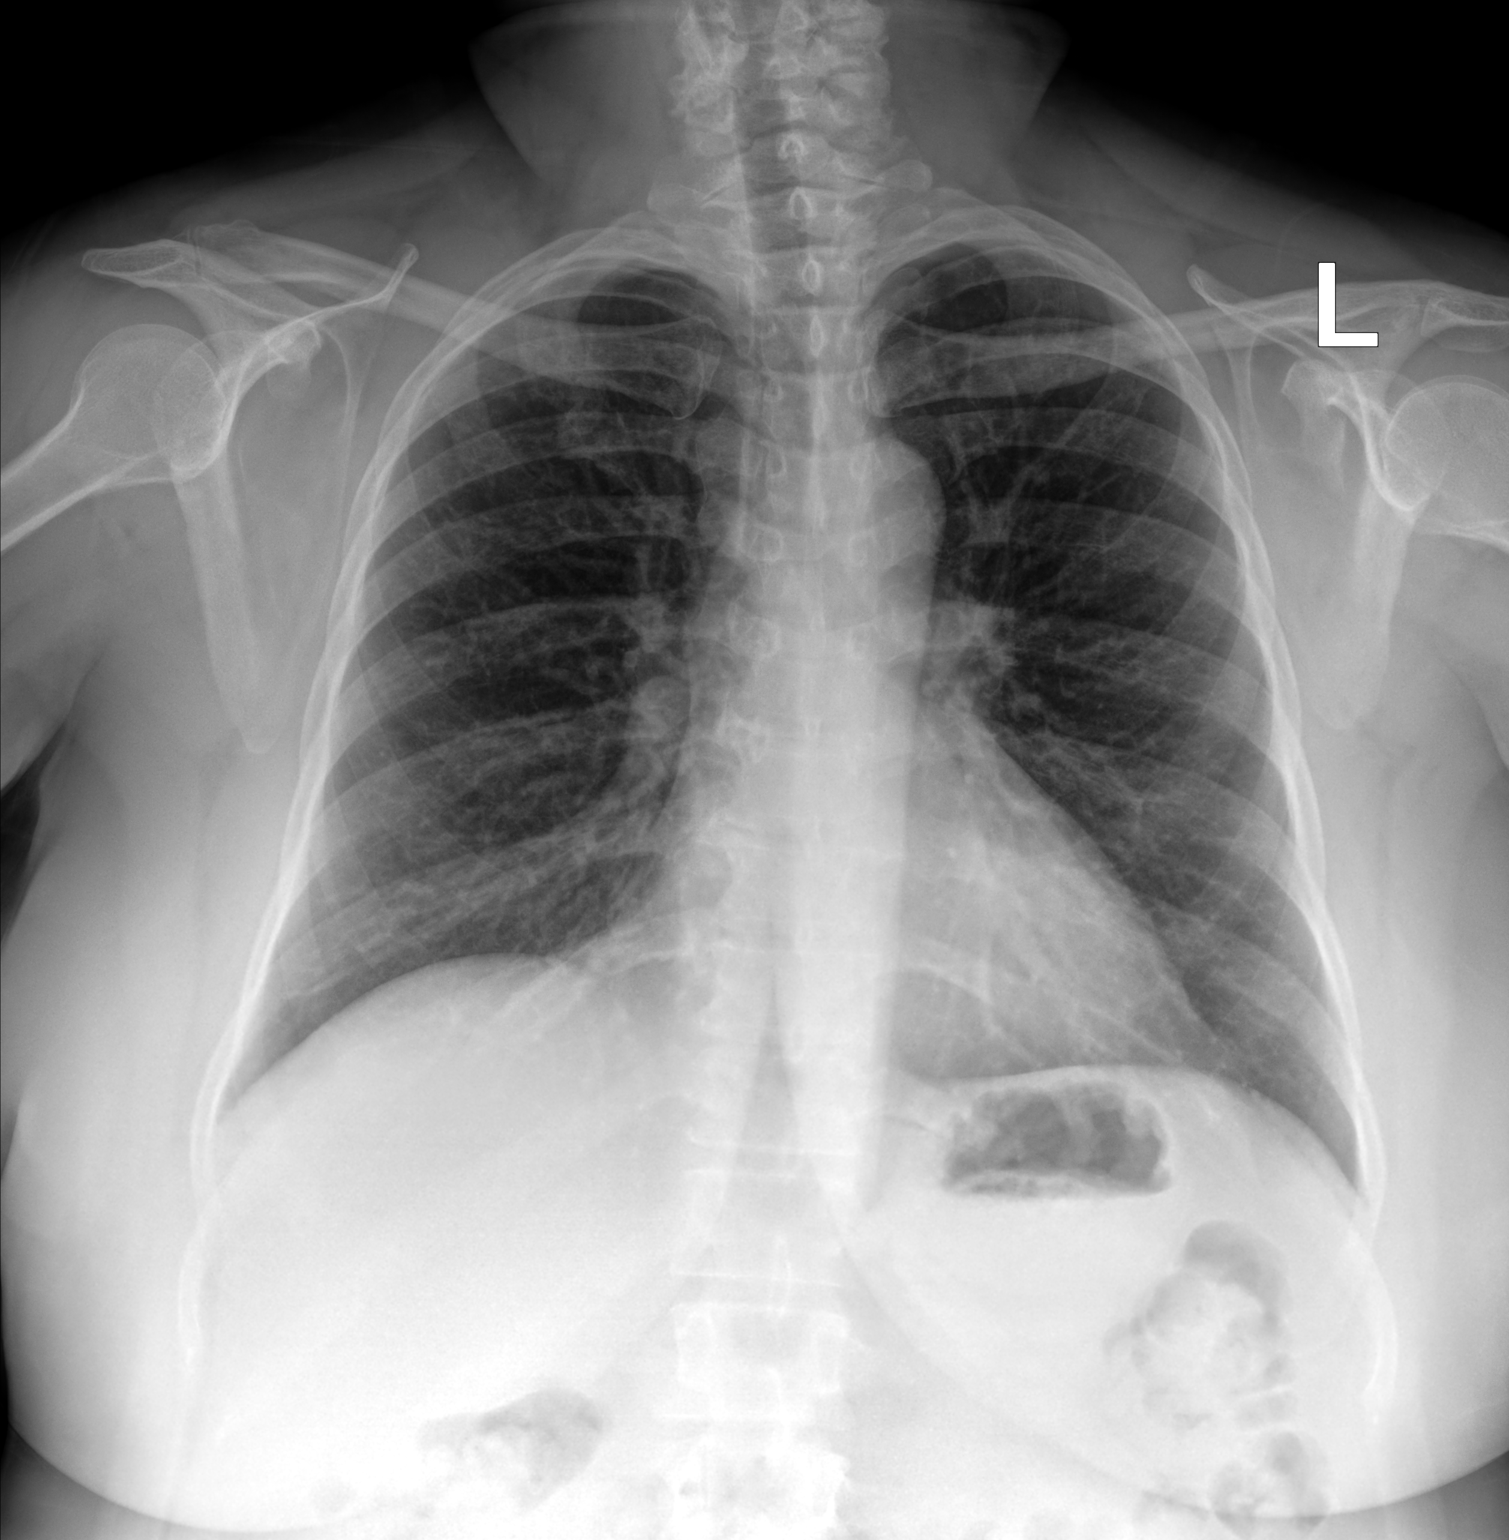

[chest lat]
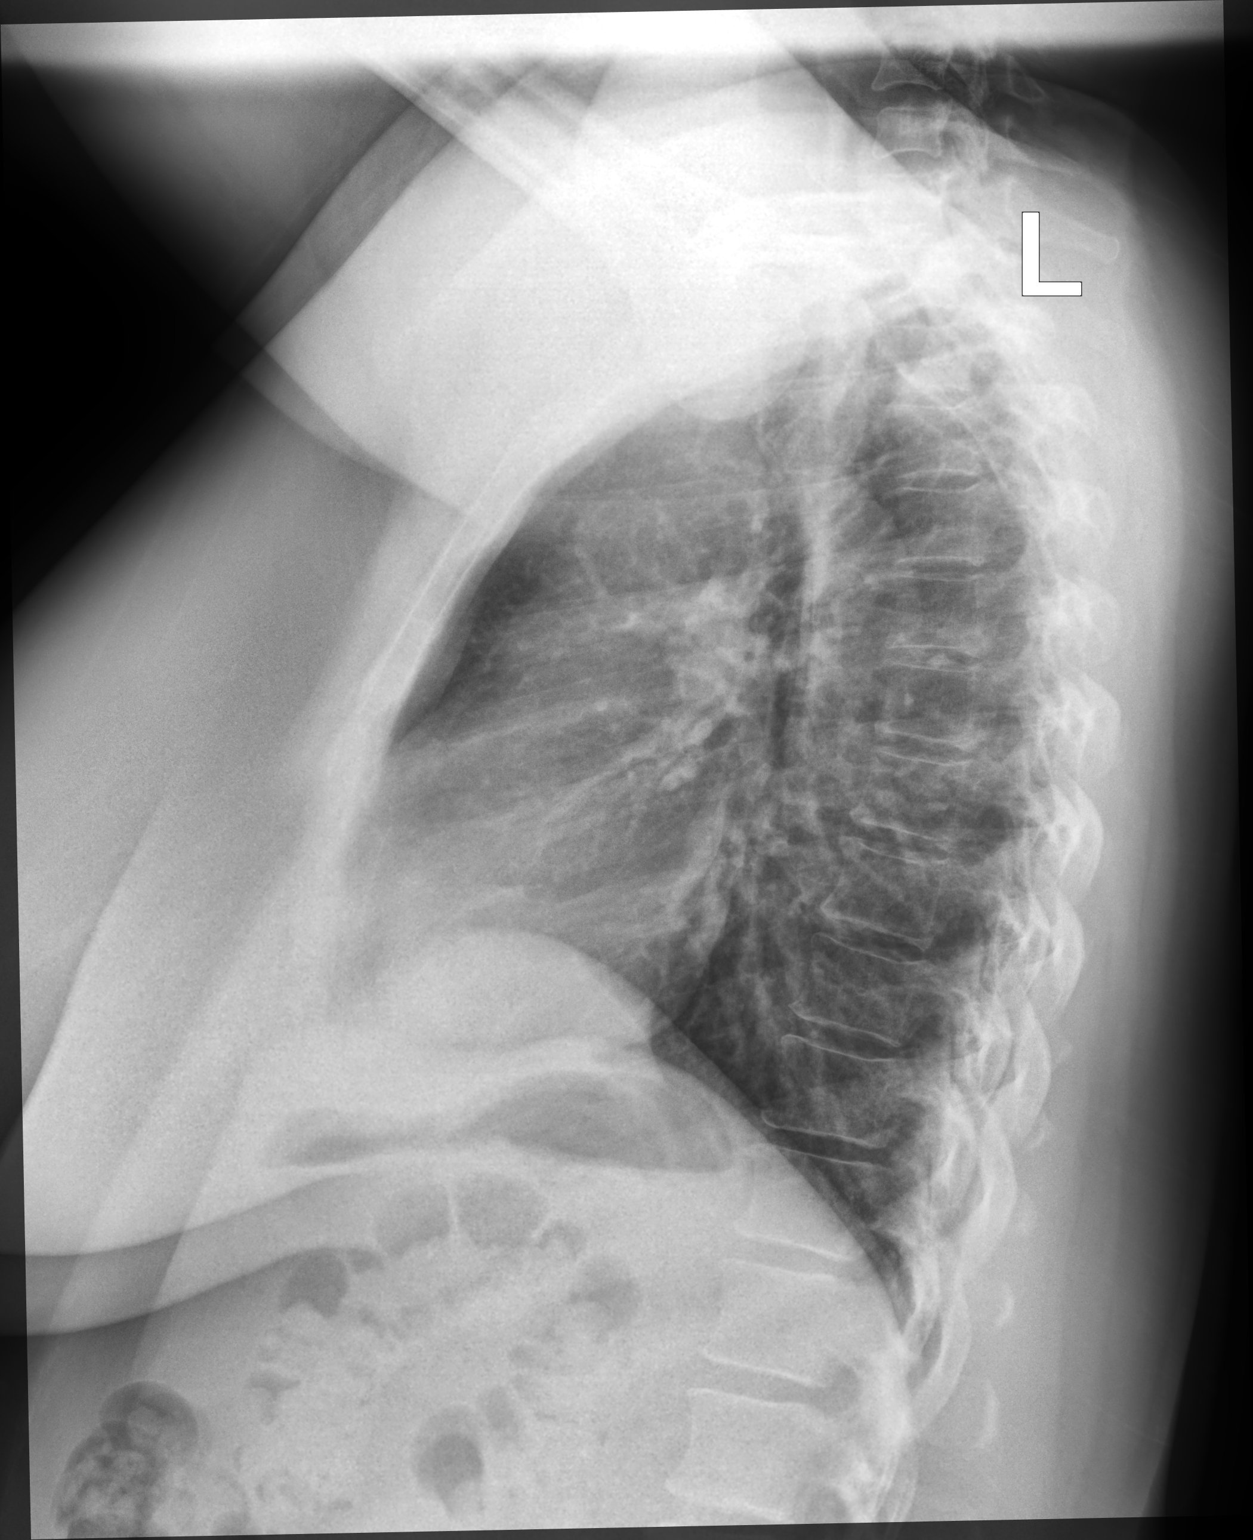

[2 of 2 positions shown; findings below may reference images not displayed]

FINDINGS: The heart size and mediastinal contours are within normal limits.
Both lungs are clear. Scoliosis of spine is noted. The visualized
skeletal structures are otherwise unremarkable.
IMPRESSION: No active cardiopulmonary disease.

## 2024-01-13 ENCOUNTER — Other Ambulatory Visit: Payer: Self-pay

## 2024-01-13 ENCOUNTER — Encounter (HOSPITAL_BASED_OUTPATIENT_CLINIC_OR_DEPARTMENT_OTHER): Payer: Self-pay | Admitting: Emergency Medicine

## 2024-01-13 DIAGNOSIS — R1011 Right upper quadrant pain: Secondary | ICD-10-CM | POA: Insufficient documentation

## 2024-01-13 DIAGNOSIS — I10 Essential (primary) hypertension: Secondary | ICD-10-CM | POA: Insufficient documentation

## 2024-01-13 DIAGNOSIS — E039 Hypothyroidism, unspecified: Secondary | ICD-10-CM | POA: Insufficient documentation

## 2024-01-13 DIAGNOSIS — R1012 Left upper quadrant pain: Secondary | ICD-10-CM | POA: Diagnosis not present

## 2024-01-13 DIAGNOSIS — R1010 Upper abdominal pain, unspecified: Secondary | ICD-10-CM | POA: Diagnosis present

## 2024-01-13 DIAGNOSIS — R1013 Epigastric pain: Secondary | ICD-10-CM | POA: Diagnosis not present

## 2024-01-13 DIAGNOSIS — Z79899 Other long term (current) drug therapy: Secondary | ICD-10-CM | POA: Diagnosis not present

## 2024-01-13 NOTE — ED Triage Notes (Signed)
 Pt presents to the ED via POV with complaints of abdominal pain for years and years. She states that the pain has gotten worse over the last few weeks with intermittent nausea. Pt actively drinking mt dew in triage. Rates the pain 12/10. A&Ox4 at this time. Denies CP or SOB.

## 2024-01-14 ENCOUNTER — Emergency Department (HOSPITAL_BASED_OUTPATIENT_CLINIC_OR_DEPARTMENT_OTHER)

## 2024-01-14 ENCOUNTER — Emergency Department (HOSPITAL_BASED_OUTPATIENT_CLINIC_OR_DEPARTMENT_OTHER)
Admission: EM | Admit: 2024-01-14 | Discharge: 2024-01-14 | Disposition: A | Discharge status: Home / Self Care | Attending: Emergency Medicine | Admitting: Emergency Medicine

## 2024-01-14 DIAGNOSIS — R1013 Epigastric pain: Secondary | ICD-10-CM

## 2024-01-14 LAB — COMPREHENSIVE METABOLIC PANEL WITH GFR
ALT: 24 U/L (ref 0–44)
AST: 23 U/L (ref 15–41)
Albumin: 4 g/dL (ref 3.5–5.0)
Alkaline Phosphatase: 101 U/L (ref 38–126)
Anion gap: 10 (ref 5–15)
BUN: 15 mg/dL (ref 6–20)
CO2: 25 mmol/L (ref 22–32)
Calcium: 9.4 mg/dL (ref 8.9–10.3)
Chloride: 103 mmol/L (ref 98–111)
Creatinine, Ser: 0.8 mg/dL (ref 0.44–1.00)
GFR, Estimated: >60 mL/min (ref >60)
Glucose, Bld: 136 mg/dL — ABNORMAL HIGH (ref 70–99)
Potassium: 4.4 mmol/L (ref 3.5–5.1)
Sodium: 138 mmol/L (ref 135–145)
Total Bilirubin: 0.2 mg/dL (ref 0.0–1.2)
Total Protein: 7 g/dL (ref 6.5–8.1)

## 2024-01-14 LAB — URINALYSIS, ROUTINE W REFLEX MICROSCOPIC
Bilirubin Urine: NEGATIVE
Glucose, UA: NEGATIVE mg/dL
Ketones, ur: NEGATIVE mg/dL
Leukocytes,Ua: NEGATIVE
Nitrite: NEGATIVE
Protein, ur: NEGATIVE mg/dL
Specific Gravity, Urine: 1.015 (ref 1.005–1.030)
pH: 5.5 (ref 5.0–8.0)

## 2024-01-14 LAB — CBC
HCT: 41.2 % (ref 36.0–46.0)
Hemoglobin: 14.5 g/dL (ref 12.0–15.0)
MCH: 31.7 pg (ref 26.0–34.0)
MCHC: 35.2 g/dL (ref 30.0–36.0)
MCV: 90 fL (ref 80.0–100.0)
Platelets: 330 10*3/uL (ref 150–400)
RBC: 4.58 MIL/uL (ref 3.87–5.11)
RDW: 11.6 % (ref 11.5–15.5)
WBC: 9.5 10*3/uL (ref 4.0–10.5)
nRBC: 0 % (ref 0.0–0.2)

## 2024-01-14 LAB — URINALYSIS, MICROSCOPIC (REFLEX)

## 2024-01-14 LAB — LIPASE, BLOOD: Lipase: 37 U/L (ref 11–51)

## 2024-01-14 MED ORDER — ONDANSETRON HCL 4 MG/2ML IJ SOLN
4.0000 mg | Freq: Once | INTRAMUSCULAR | Status: AC
  Administered 2024-01-14: 4 mg via INTRAVENOUS
  Filled 2024-01-14: qty 2

## 2024-01-14 MED ORDER — IOHEXOL 300 MG/ML  SOLN
100.0000 mL | Freq: Once | INTRAMUSCULAR | Status: AC | PRN
  Administered 2024-01-14: 100 mL via INTRAVENOUS

## 2024-01-14 MED ORDER — KETOROLAC TROMETHAMINE 30 MG/ML IJ SOLN
30.0000 mg | Freq: Once | INTRAMUSCULAR | Status: AC
  Administered 2024-01-14: 30 mg via INTRAVENOUS
  Filled 2024-01-14: qty 1

## 2024-01-14 MED ORDER — SODIUM CHLORIDE 0.9 % IV BOLUS
1000.0000 mL | Freq: Once | INTRAVENOUS | Status: AC
  Administered 2024-01-14: 1000 mL via INTRAVENOUS

## 2024-01-14 NOTE — ED Provider Notes (Signed)
 [facility] Provider Note   CSN: [id number] Arrival date & time: 01/13/24  2336     Patient presents with: Abdominal Pain   Christine Rush is a 53 y.o. female.   Patient is a 53 year old female with past medical history of hypertension, hypothyroidism.  Patient presenting with complaints of abdominal pain.  She describes discomfort across her upper abdomen that has been present for years and years.  She tells me she has been seen by her GI doctor and underwent an appendectomy revealing inconclusive results.  The patient states that her pain is worsening and she does not know what to do.  No fevers or chills.  No bloody stool or vomit.  Pain is worse with movement or palpation.  No alleviating factors.       Prior to Admission medications   Medication Sig Start Date End Date Taking? Authorizing Provider  azelastine (OPTIVAR) 0.05 % ophthalmic solution Place 1 drop into both eyes in the morning and at bedtime. 04/26/20   [provider]  azithromycin  (ZITHROMAX ) 250 MG tablet Take 1 tablet (250 mg total) by mouth daily. Take first 2 tablets together, then 1 every day until finished. 08/20/20   Wellington Half, FNP  calcium carbonate (OS-CAL) 600 MG TABS Take 600 mg by mouth daily. Patient not taking: Reported on 05/05/2020    [provider]  cetirizine (ZYRTEC) 10 MG tablet Take 10 mg by mouth daily. Patient not taking: Reported on 05/05/2020    [provider]  cholecalciferol (VITAMIN D) 1000 UNITS tablet Take 2,000 Units by mouth daily.     [provider]  cimetidine (TAGAMET) 200 MG tablet Take 200 mg by mouth daily as needed. For acid reflux    [provider]  Cyanocobalamin (VITAMIN B-12 PO) Take by mouth every morning.     [provider]  glucosamine-chondroitin 500-400 MG tablet Take 1 tablet by mouth every morning.     [provider]  hydrochlorothiazide   (MICROZIDE ) 12.5 MG capsule Take 12.5 mg by mouth every morning.     [provider]  ibuprofen  (ADVIL ,MOTRIN ) 200 MG tablet Take 400 mg by mouth every 6 (six) hours as needed. For pain    [provider]  levothyroxine  (SYNTHROID ) 200 MCG tablet Take 200 mcg by mouth daily before breakfast.     [provider]  losartan (COZAAR) 50 MG tablet Take 50 mg by mouth daily.    [provider]  LUTEIN PO Take 1 capsule by mouth daily.     [provider]  Multiple Vitamin (MULTIVITAMIN WITH MINERALS) TABS tablet Take 1 tablet by mouth daily.    [provider]  naproxen sodium (ANAPROX) 220 MG tablet Take 220 mg by mouth 2 (two) times daily as needed. For migraines. Patient not taking: Reported on 05/05/2020    [provider]  oxyCODONE -acetaminophen  (PERCOCET) 7.5-325 MG tablet Take 1 tablet by mouth every 6 (six) hours as needed for severe pain. 05/24/20   Merryl Abraham, MD  pantoprazole  (PROTONIX ) 40 MG tablet Take 1 tablet (40 mg total) by mouth daily. Patient taking differently: Take 40 mg by mouth daily as needed (acid reflux).  01/04/20   Alissa April, MD  sodium chloride  (OCEAN) 0.65 % SOLN nasal spray Place 1 spray into both nostrils as needed for congestion.    [provider]  vitamin A 8000 UNIT capsule Take 8,000 Units by mouth daily.   Patient not taking:  Reported on 05/05/2020    [provider]    Allergies: Patient has no known allergies.    Review of Systems  All other systems reviewed and are negative.   Updated Vital Signs BP 137/88   Pulse 72   Temp 97.9 F (36.6 C) (Oral)   Resp 18   Ht 5' 2 (1.575 m)   Wt 81.6 kg   LMP 05/09/2020   SpO2 99%   BMI 32.92 kg/m   Physical Exam Vitals and nursing note reviewed.  Constitutional:      General: She is not in acute distress.    Appearance: She is well-developed. She is not diaphoretic.  HENT:     Head: Normocephalic and atraumatic.    Cardiovascular:     Rate and Rhythm: Normal rate and regular rhythm.     Heart sounds: No murmur heard.    No friction rub. No gallop.  Pulmonary:     Effort: Pulmonary effort is normal. No respiratory distress.     Breath sounds: Normal breath sounds. No wheezing.  Abdominal:     General: Bowel sounds are normal. There is no distension.     Palpations: Abdomen is soft.     Tenderness: There is abdominal tenderness in the right upper quadrant, epigastric area and left upper quadrant. There is no right CVA tenderness, left CVA tenderness, guarding or rebound.   Musculoskeletal:        General: Normal range of motion.     Cervical back: Normal range of motion and neck supple.   Skin:    General: Skin is warm and dry.   Neurological:     General: No focal deficit present.     Mental Status: She is alert and oriented to person, place, and time.     (all labs ordered are listed, but only abnormal results are displayed) Labs Reviewed  COMPREHENSIVE METABOLIC PANEL WITH GFR - Abnormal; Notable for the following components:      Result Value   Glucose, Bld 136 (*)    All other components within normal limits  URINALYSIS, ROUTINE W REFLEX MICROSCOPIC - Abnormal; Notable for the following components:   Hgb urine dipstick TRACE (*)    All other components within normal limits  URINALYSIS, MICROSCOPIC (REFLEX) - Abnormal; Notable for the following components:   Bacteria, UA RARE (*)    All other components within normal limits  LIPASE, BLOOD  CBC    EKG: None  Radiology: No results found.   Procedures   Medications Ordered in the ED  sodium chloride  0.9 % bolus 1,000 mL (has no administration in time range)  ondansetron  (ZOFRAN ) injection 4 mg (has no administration in time range)  ketorolac  (TORADOL ) 30 MG/ML injection 30 mg (has no administration in time range)                                    Medical Decision Making Amount and/or Complexity of Data  Reviewed Labs: ordered. Radiology: ordered.  Risk Prescription drug management.   Patient presenting with abdominal pain that has been an ongoing problem with her for quite some time.  She arrives here with stable vital signs and is afebrile.  She has tenderness to the epigastric region, but no peritoneal signs.  Laboratory studies obtained including CBC, CMP, and lipase, all of which are normal.  CT scan of the abdomen and pelvis shows diverticulosis, but no acute  findings.  Patient hydrated with normal saline and given Toradol  for pain and Zofran  for nausea.  At this point, symptoms are chronic with no acute findings in the workup.  I feels that she can safely be discharged back to her gastroenterologist to discuss the ongoing issues.     Final diagnoses:  None    ED Discharge Orders     None          Orvilla Blander, MD 01/14/24 0345

## 2024-01-14 NOTE — Discharge Instructions (Signed)
 Follow-up with your gastroenterologist in the next week.  Take ibuprofen  600 mg every 6 hours as needed for pain.

## 2024-01-15 ENCOUNTER — Other Ambulatory Visit (HOSPITAL_COMMUNITY): Payer: Self-pay | Admitting: Family Medicine

## 2024-01-15 DIAGNOSIS — R079 Chest pain, unspecified: Secondary | ICD-10-CM

## 2024-02-18 ENCOUNTER — Ambulatory Visit (HOSPITAL_COMMUNITY)

## 2024-03-09 ENCOUNTER — Telehealth (HOSPITAL_COMMUNITY): Payer: Self-pay | Admitting: *Deleted

## 2024-03-09 NOTE — Telephone Encounter (Signed)
 Received call from patient regarding upcoming cardiac imaging study; pt verbalizes understanding of appt date/time, parking situation and where to check in, pre-test NPO status and medications ordered, and verified current allergies; name and call back number provided for further questions should they arise Sid Seats RN Navigator Cardiac Imaging [facility] [phone] office [phone] cell

## 2024-03-09 NOTE — Telephone Encounter (Signed)
 Attempted to call patient regarding upcoming cardiac CT appointment. Left message on voicemail with name and callback number Sid Seats RN Navigator Cardiac Imaging [facility] [phone] Office

## 2024-03-10 ENCOUNTER — Ambulatory Visit (HOSPITAL_COMMUNITY)
Admission: RE | Admit: 2024-03-10 | Discharge: 2024-03-10 | Disposition: A | Discharge status: Home / Self Care | Source: Ambulatory Visit | Attending: Family Medicine | Admitting: Family Medicine

## 2024-03-10 ENCOUNTER — Encounter (HOSPITAL_COMMUNITY): Payer: Self-pay

## 2024-03-10 DIAGNOSIS — R079 Chest pain, unspecified: Secondary | ICD-10-CM

## 2024-03-10 MED ORDER — NITROGLYCERIN 0.4 MG SL SUBL: 0.8000 mg | SUBLINGUAL_TABLET | Freq: Once | SUBLINGUAL | Status: DC

## 2024-05-06 ENCOUNTER — Encounter (HOSPITAL_COMMUNITY): Payer: Self-pay
